# Patient Record
Sex: Female | Born: 1955 | Race: White | Hispanic: No | Marital: Single | Smoking: Former smoker
Health system: Southern US, Community
[De-identification: ages and names within clinical notes are randomized; demographics above are authoritative.]

## PROBLEM LIST (undated history)

## (undated) DIAGNOSIS — R112 Nausea with vomiting, unspecified: Secondary | ICD-10-CM

## (undated) DIAGNOSIS — Z87440 Personal history of urinary (tract) infections: Secondary | ICD-10-CM

## (undated) DIAGNOSIS — D329 Benign neoplasm of meninges, unspecified: Secondary | ICD-10-CM

## (undated) DIAGNOSIS — C801 Malignant (primary) neoplasm, unspecified: Secondary | ICD-10-CM

## (undated) DIAGNOSIS — J40 Bronchitis, not specified as acute or chronic: Secondary | ICD-10-CM

## (undated) DIAGNOSIS — M1990 Unspecified osteoarthritis, unspecified site: Secondary | ICD-10-CM

## (undated) DIAGNOSIS — Z972 Presence of dental prosthetic device (complete) (partial): Secondary | ICD-10-CM

## (undated) DIAGNOSIS — J329 Chronic sinusitis, unspecified: Secondary | ICD-10-CM

## (undated) DIAGNOSIS — J45909 Unspecified asthma, uncomplicated: Secondary | ICD-10-CM

## (undated) DIAGNOSIS — Z9889 Other specified postprocedural states: Secondary | ICD-10-CM

## (undated) DIAGNOSIS — Z9221 Personal history of antineoplastic chemotherapy: Secondary | ICD-10-CM

## (undated) HISTORY — DX: Unspecified asthma, uncomplicated: J45.909

## (undated) HISTORY — PX: TUBAL LIGATION: SHX77

## (undated) HISTORY — PX: CHOLECYSTECTOMY: SHX55

---

## 2009-12-21 HISTORY — PX: BREAST SURGERY: SHX581

## 2010-02-18 DIAGNOSIS — C801 Malignant (primary) neoplasm, unspecified: Secondary | ICD-10-CM

## 2010-02-18 HISTORY — DX: Malignant (primary) neoplasm, unspecified: C80.1

## 2012-07-08 DIAGNOSIS — Z9010 Acquired absence of unspecified breast and nipple: Secondary | ICD-10-CM | POA: Insufficient documentation

## 2013-03-08 ENCOUNTER — Other Ambulatory Visit (HOSPITAL_COMMUNITY): Payer: Self-pay | Admitting: Orthopaedic Surgery

## 2013-05-09 ENCOUNTER — Encounter (HOSPITAL_COMMUNITY): Payer: Self-pay | Admitting: Pharmacy Technician

## 2013-05-12 ENCOUNTER — Ambulatory Visit (HOSPITAL_COMMUNITY)
Admission: RE | Admit: 2013-05-12 | Discharge: 2013-05-12 | Disposition: A | Discharge status: Home / Self Care | Payer: Managed Care, Other (non HMO) | Source: Ambulatory Visit | Attending: Orthopaedic Surgery | Admitting: Orthopaedic Surgery

## 2013-05-12 ENCOUNTER — Encounter (HOSPITAL_COMMUNITY)
Admission: RE | Admit: 2013-05-12 | Discharge: 2013-05-12 | Disposition: A | Discharge status: Home / Self Care | Payer: Managed Care, Other (non HMO) | Source: Ambulatory Visit | Attending: Orthopaedic Surgery | Admitting: Orthopaedic Surgery

## 2013-05-12 ENCOUNTER — Encounter (HOSPITAL_COMMUNITY): Payer: Self-pay

## 2013-05-12 DIAGNOSIS — D329 Benign neoplasm of meninges, unspecified: Secondary | ICD-10-CM

## 2013-05-12 DIAGNOSIS — Z9010 Acquired absence of unspecified breast and nipple: Secondary | ICD-10-CM | POA: Insufficient documentation

## 2013-05-12 DIAGNOSIS — Z01818 Encounter for other preprocedural examination: Secondary | ICD-10-CM | POA: Insufficient documentation

## 2013-05-12 DIAGNOSIS — Z01812 Encounter for preprocedural laboratory examination: Secondary | ICD-10-CM | POA: Insufficient documentation

## 2013-05-12 DIAGNOSIS — Z87891 Personal history of nicotine dependence: Secondary | ICD-10-CM | POA: Insufficient documentation

## 2013-05-12 DIAGNOSIS — M1990 Unspecified osteoarthritis, unspecified site: Secondary | ICD-10-CM | POA: Insufficient documentation

## 2013-05-12 DIAGNOSIS — C801 Malignant (primary) neoplasm, unspecified: Secondary | ICD-10-CM | POA: Insufficient documentation

## 2013-05-12 DIAGNOSIS — J40 Bronchitis, not specified as acute or chronic: Secondary | ICD-10-CM

## 2013-05-12 HISTORY — DX: Benign neoplasm of meninges, unspecified: D32.9

## 2013-05-12 HISTORY — DX: Bronchitis, not specified as acute or chronic: J40

## 2013-05-12 HISTORY — PX: OTHER SURGICAL HISTORY: SHX169

## 2013-05-12 HISTORY — DX: Chronic sinusitis, unspecified: J32.9

## 2013-05-12 HISTORY — DX: Malignant (primary) neoplasm, unspecified: C80.1

## 2013-05-12 HISTORY — DX: Other specified postprocedural states: R11.2

## 2013-05-12 HISTORY — DX: Personal history of urinary (tract) infections: Z87.440

## 2013-05-12 HISTORY — DX: Other specified postprocedural states: Z98.890

## 2013-05-12 LAB — BASIC METABOLIC PANEL
BUN: 12 mg/dL (ref 6–23)
CO2: 28 mEq/L (ref 19–32)
Chloride: 101 mEq/L (ref 96–112)
Creatinine, Ser: 0.6 mg/dL (ref 0.50–1.10)
GFR calc Af Amer: >90 mL/min (ref >90)
Potassium: 3.9 mEq/L (ref 3.5–5.1)

## 2013-05-12 LAB — CBC
HCT: 41 % (ref 36.0–46.0)
Hemoglobin: 14 g/dL (ref 12.0–15.0)
MCHC: 34.1 g/dL (ref 30.0–36.0)
MCV: 94 fL (ref 78.0–100.0)
WBC: 5.5 10*3/uL (ref 4.0–10.5)

## 2013-05-12 LAB — PROTIME-INR
INR: 0.91 (ref 0.00–1.49)
Prothrombin Time: 12.2 seconds (ref 11.6–15.2)

## 2013-05-12 LAB — SURGICAL PCR SCREEN: MRSA, PCR: NEGATIVE

## 2013-05-12 LAB — URINALYSIS, ROUTINE W REFLEX MICROSCOPIC
Bilirubin Urine: NEGATIVE
Glucose, UA: NEGATIVE mg/dL
Hgb urine dipstick: NEGATIVE
Specific Gravity, Urine: 1.01 (ref 1.005–1.030)

## 2013-05-12 NOTE — Pre-Procedure Instructions (Addendum)
05-12-13 CXR done today 05-12-13 1535 Pt. Notified of Positive Staph aureus PCR screen -will use Mupirocin ointment as instructed. RX. Called into [facility] [place], [place] per pt. Request.

## 2013-05-12 NOTE — Patient Instructions (Addendum)
20 Lache Dagher  05/12/2013   Your procedure is scheduled on: 6-6  -2014  Report to [facility] at     0700   AM   Call this number if you have problems the morning of surgery: [phone]  Or Presurgical Testing [phone](Seanpatrick Maisano)      Do not eat food:After Midnight.    Take these medicines the morning of surgery with A SIP OF WATER: Femara. Ask MD about Meloxicam. Stop all over the counter vitamins and herbal supplements.   Do not wear jewelry, make-up or nail polish.  Do not wear lotions, powders, or perfumes. You may wear deodorant.  Do not shave 12 hours prior to first CHG shower(legs and under arms).(face and neck okay.)  Do not bring valuables to the hospital.  Contacts, dentures or bridgework,body piercing,  may not be worn into surgery.  Leave suitcase in the car. After surgery it may be brought to your room.  For patients admitted to the hospital, checkout time is 11:00 AM the day of discharge.   Patients discharged the day of surgery will not be allowed to drive home. Must have responsible person with you x 24 hours once discharged.  Name and phone number of your driver: Destini Cambre -daughter [phone] cell  Special Instructions: CHG(Chlorhedine 4%-"Hibiclens","Betasept","Aplicare") Education officer, community Wash: see special instructions.(avoid face and genitals)   Please read over the following fact sheets that you were given: MRSA Information, Blood Transfusion fact sheet, Incentive Spirometry Instruction.    Failure to follow these instructions may result in Cancellation of your surgery.   Patient signature_______________________________________________________

## 2013-05-26 ENCOUNTER — Encounter (HOSPITAL_COMMUNITY)
Admission: RE | Disposition: A | Discharge status: Home Health | Payer: Self-pay | Source: Ambulatory Visit | Attending: Orthopaedic Surgery

## 2013-05-26 ENCOUNTER — Inpatient Hospital Stay (HOSPITAL_COMMUNITY)
Admission: RE | Admit: 2013-05-26 | Discharge: 2013-05-28 | DRG: 470 | Disposition: A | Discharge status: Home Health | Payer: Managed Care, Other (non HMO) | Source: Ambulatory Visit | Attending: Orthopaedic Surgery | Admitting: Orthopaedic Surgery

## 2013-05-26 ENCOUNTER — Encounter (HOSPITAL_COMMUNITY): Payer: Self-pay | Admitting: Anesthesiology

## 2013-05-26 ENCOUNTER — Inpatient Hospital Stay (HOSPITAL_COMMUNITY): Payer: Managed Care, Other (non HMO)

## 2013-05-26 ENCOUNTER — Inpatient Hospital Stay (HOSPITAL_COMMUNITY): Payer: Managed Care, Other (non HMO) | Admitting: Anesthesiology

## 2013-05-26 ENCOUNTER — Encounter (HOSPITAL_COMMUNITY): Payer: Self-pay | Admitting: *Deleted

## 2013-05-26 DIAGNOSIS — D62 Acute posthemorrhagic anemia: Secondary | ICD-10-CM | POA: Diagnosis not present

## 2013-05-26 DIAGNOSIS — M169 Osteoarthritis of hip, unspecified: Principal | ICD-10-CM | POA: Diagnosis present

## 2013-05-26 DIAGNOSIS — M1610 Unilateral primary osteoarthritis, unspecified hip: Principal | ICD-10-CM | POA: Diagnosis present

## 2013-05-26 DIAGNOSIS — T50995A Adverse effect of other drugs, medicaments and biological substances, initial encounter: Secondary | ICD-10-CM | POA: Diagnosis not present

## 2013-05-26 DIAGNOSIS — R110 Nausea: Secondary | ICD-10-CM | POA: Diagnosis not present

## 2013-05-26 HISTORY — PX: TOTAL HIP ARTHROPLASTY: SHX124

## 2013-05-26 LAB — TYPE AND SCREEN
ABO/RH(D): O POS
Antibody Screen: NEGATIVE

## 2013-05-26 SURGERY — ARTHROPLASTY, HIP, TOTAL, ANTERIOR APPROACH
Anesthesia: Spinal | Site: Hip | Laterality: Left | Wound class: Clean

## 2013-05-26 MED ORDER — MENTHOL 3 MG MT LOZG: 1.0000 | LOZENGE | OROMUCOSAL | Status: DC | PRN

## 2013-05-26 MED ORDER — CEFAZOLIN SODIUM 1-5 GM-% IV SOLN
1.0000 g | Freq: Four times a day (QID) | INTRAVENOUS | Status: AC
  Administered 2013-05-26 (×2): 1 g via INTRAVENOUS
  Filled 2013-05-26 (×2): qty 50

## 2013-05-26 MED ORDER — ASPIRIN EC 325 MG PO TBEC
325.0000 mg | DELAYED_RELEASE_TABLET | Freq: Two times a day (BID) | ORAL | Status: DC
  Administered 2013-05-26 – 2013-05-28 (×4): 325 mg via ORAL
  Filled 2013-05-26 (×6): qty 1

## 2013-05-26 MED ORDER — BUPIVACAINE HCL (PF) 0.5 % IJ SOLN
INTRAMUSCULAR | Status: DC | PRN
  Administered 2013-05-26: 12.5 mg

## 2013-05-26 MED ORDER — LETROZOLE 2.5 MG PO TABS
2.5000 mg | ORAL_TABLET | Freq: Every morning | ORAL | Status: DC
  Filled 2013-05-26: qty 1

## 2013-05-26 MED ORDER — GLYCOPYRROLATE 0.2 MG/ML IJ SOLN
INTRAMUSCULAR | Status: DC | PRN
  Administered 2013-05-26: 0.2 mg via INTRAVENOUS

## 2013-05-26 MED ORDER — VITAMIN C 500 MG PO TABS
500.0000 mg | ORAL_TABLET | Freq: Every day | ORAL | Status: DC
  Administered 2013-05-27 – 2013-05-28 (×2): 500 mg via ORAL
  Filled 2013-05-26 (×3): qty 1

## 2013-05-26 MED ORDER — METOCLOPRAMIDE HCL 5 MG/ML IJ SOLN: 5.0000 mg | Freq: Three times a day (TID) | INTRAMUSCULAR | Status: DC | PRN

## 2013-05-26 MED ORDER — PHENYLEPHRINE HCL 10 MG/ML IJ SOLN
10.0000 mg | INTRAVENOUS | Status: DC | PRN
  Administered 2013-05-26: 50 ug/min via INTRAVENOUS

## 2013-05-26 MED ORDER — HYDROMORPHONE HCL PF 1 MG/ML IJ SOLN: 1.0000 mg | INTRAMUSCULAR | Status: DC | PRN

## 2013-05-26 MED ORDER — FENTANYL CITRATE 0.05 MG/ML IJ SOLN: 25.0000 ug | INTRAMUSCULAR | Status: DC | PRN

## 2013-05-26 MED ORDER — KETOROLAC TROMETHAMINE 30 MG/ML IJ SOLN: 15.0000 mg | Freq: Once | INTRAMUSCULAR | Status: DC | PRN

## 2013-05-26 MED ORDER — ONDANSETRON HCL 4 MG/2ML IJ SOLN: 4.0000 mg | Freq: Four times a day (QID) | INTRAMUSCULAR | Status: DC | PRN

## 2013-05-26 MED ORDER — PROMETHAZINE HCL 25 MG/ML IJ SOLN: 6.2500 mg | INTRAMUSCULAR | Status: DC | PRN

## 2013-05-26 MED ORDER — OXYCODONE HCL 5 MG PO TABS
5.0000 mg | ORAL_TABLET | ORAL | Status: DC | PRN
  Administered 2013-05-26 – 2013-05-27 (×4): 5 mg via ORAL
  Administered 2013-05-27: 10 mg via ORAL
  Administered 2013-05-27 – 2013-05-28 (×3): 5 mg via ORAL
  Filled 2013-05-26 (×2): qty 1
  Filled 2013-05-26 (×2): qty 2
  Filled 2013-05-26: qty 1
  Filled 2013-05-26: qty 2
  Filled 2013-05-26 (×2): qty 1

## 2013-05-26 MED ORDER — METOCLOPRAMIDE HCL 10 MG PO TABS
5.0000 mg | ORAL_TABLET | Freq: Three times a day (TID) | ORAL | Status: DC | PRN
  Administered 2013-05-28: 10 mg via ORAL
  Filled 2013-05-26: qty 1

## 2013-05-26 MED ORDER — PROPOFOL INFUSION 10 MG/ML OPTIME
INTRAVENOUS | Status: DC | PRN
  Administered 2013-05-26: 120 ug/kg/min via INTRAVENOUS

## 2013-05-26 MED ORDER — FENTANYL CITRATE 0.05 MG/ML IJ SOLN
INTRAMUSCULAR | Status: DC | PRN
  Administered 2013-05-26 (×2): 50 ug via INTRAVENOUS

## 2013-05-26 MED ORDER — CEFAZOLIN SODIUM-DEXTROSE 2-3 GM-% IV SOLR
2.0000 g | INTRAVENOUS | Status: AC
  Administered 2013-05-26: 2 g via INTRAVENOUS

## 2013-05-26 MED ORDER — ALUM & MAG HYDROXIDE-SIMETH 200-200-20 MG/5ML PO SUSP: 30.0000 mL | ORAL | Status: DC | PRN

## 2013-05-26 MED ORDER — LACTATED RINGERS IV SOLN
INTRAVENOUS | Status: DC
  Administered 2013-05-26: 10:00:00 via INTRAVENOUS
  Administered 2013-05-26: 1000 mL via INTRAVENOUS

## 2013-05-26 MED ORDER — MIDAZOLAM HCL 5 MG/5ML IJ SOLN
INTRAMUSCULAR | Status: DC | PRN
  Administered 2013-05-26: 2 mg via INTRAVENOUS

## 2013-05-26 MED ORDER — ONDANSETRON HCL 4 MG/2ML IJ SOLN
4.0000 mg | Freq: Once | INTRAMUSCULAR | Status: AC
  Administered 2013-05-26: 4 mg via INTRAVENOUS

## 2013-05-26 MED ORDER — POLYETHYLENE GLYCOL 3350 17 G PO PACK
17.0000 g | PACK | Freq: Every day | ORAL | Status: DC | PRN
  Administered 2013-05-27: 17 g via ORAL
  Filled 2013-05-26: qty 1

## 2013-05-26 MED ORDER — METHOCARBAMOL 100 MG/ML IJ SOLN: 500.0000 mg | Freq: Four times a day (QID) | INTRAVENOUS | Status: DC | PRN

## 2013-05-26 MED ORDER — EPHEDRINE SULFATE 50 MG/ML IJ SOLN
INTRAMUSCULAR | Status: DC | PRN
  Administered 2013-05-26: 10 mg via INTRAVENOUS
  Administered 2013-05-26: 5 mg via INTRAVENOUS
  Administered 2013-05-26: 10 mg via INTRAVENOUS

## 2013-05-26 MED ORDER — BISACODYL 10 MG RE SUPP: 10.0000 mg | Freq: Every day | RECTAL | Status: DC | PRN

## 2013-05-26 MED ORDER — FERROUS SULFATE 325 (65 FE) MG PO TABS
325.0000 mg | ORAL_TABLET | Freq: Three times a day (TID) | ORAL | Status: DC
  Administered 2013-05-27 – 2013-05-28 (×4): 325 mg via ORAL
  Filled 2013-05-26 (×8): qty 1

## 2013-05-26 MED ORDER — SODIUM CHLORIDE 0.9 % IV SOLN
INTRAVENOUS | Status: DC
  Administered 2013-05-26 – 2013-05-27 (×2): via INTRAVENOUS

## 2013-05-26 MED ORDER — ACETAMINOPHEN 650 MG RE SUPP: 650.0000 mg | Freq: Four times a day (QID) | RECTAL | Status: DC | PRN

## 2013-05-26 MED ORDER — DEXAMETHASONE SODIUM PHOSPHATE 10 MG/ML IJ SOLN
INTRAMUSCULAR | Status: DC | PRN
  Administered 2013-05-26: 10 mg via INTRAVENOUS

## 2013-05-26 MED ORDER — DIPHENHYDRAMINE HCL 12.5 MG/5ML PO ELIX: 12.5000 mg | ORAL_SOLUTION | ORAL | Status: DC | PRN

## 2013-05-26 MED ORDER — ZOLPIDEM TARTRATE 5 MG PO TABS: 5.0000 mg | ORAL_TABLET | Freq: Every evening | ORAL | Status: DC | PRN

## 2013-05-26 MED ORDER — ACETAMINOPHEN 325 MG PO TABS: 650.0000 mg | ORAL_TABLET | Freq: Four times a day (QID) | ORAL | Status: DC | PRN

## 2013-05-26 MED ORDER — SODIUM CHLORIDE 0.9 % IR SOLN
Status: DC | PRN
  Administered 2013-05-26: 1000 mL

## 2013-05-26 MED ORDER — ONDANSETRON HCL 4 MG PO TABS
4.0000 mg | ORAL_TABLET | Freq: Four times a day (QID) | ORAL | Status: DC | PRN
  Administered 2013-05-27 – 2013-05-28 (×3): 4 mg via ORAL
  Filled 2013-05-26 (×3): qty 1

## 2013-05-26 MED ORDER — DOCUSATE SODIUM 100 MG PO CAPS
100.0000 mg | ORAL_CAPSULE | Freq: Two times a day (BID) | ORAL | Status: DC
Start: 2013-05-26 — End: 2013-05-28
  Administered 2013-05-27 – 2013-05-28 (×3): 100 mg via ORAL
  Filled 2013-05-26 (×4): qty 1

## 2013-05-26 MED ORDER — PHENOL 1.4 % MT LIQD: 1.0000 | OROMUCOSAL | Status: DC | PRN

## 2013-05-26 MED ORDER — PHENYLEPHRINE HCL 10 MG/ML IJ SOLN
INTRAMUSCULAR | Status: DC | PRN
  Administered 2013-05-26: 40 ug via INTRAVENOUS

## 2013-05-26 MED ORDER — 0.9 % SODIUM CHLORIDE (POUR BTL) OPTIME
TOPICAL | Status: DC | PRN
  Administered 2013-05-26: 1000 mL

## 2013-05-26 MED ORDER — METHOCARBAMOL 500 MG PO TABS
500.0000 mg | ORAL_TABLET | Freq: Four times a day (QID) | ORAL | Status: DC | PRN
  Administered 2013-05-26 – 2013-05-27 (×2): 500 mg via ORAL
  Filled 2013-05-26 (×2): qty 1

## 2013-05-26 SURGICAL SUPPLY — 38 items
BAG ZIPLOCK 12X15 (MISCELLANEOUS) ×4 IMPLANT
BLADE SAW SGTL 18X1.27X75 (BLADE) ×2 IMPLANT
CAPT HIP PF COP ×2 IMPLANT
CELLS DAT CNTRL 66122 CELL SVR (MISCELLANEOUS) ×1 IMPLANT
CLOTH BEACON ORANGE TIMEOUT ST (SAFETY) ×2 IMPLANT
DERMABOND ADVANCED (GAUZE/BANDAGES/DRESSINGS) ×1
DERMABOND ADVANCED .7 DNX12 (GAUZE/BANDAGES/DRESSINGS) ×1 IMPLANT
DRAPE C-ARM 42X120 X-RAY (DRAPES) ×2 IMPLANT
DRAPE STERI IOBAN 125X83 (DRAPES) ×2 IMPLANT
DRAPE U-SHAPE 47X51 STRL (DRAPES) ×6 IMPLANT
DRSG AQUACEL AG ADV 3.5X10 (GAUZE/BANDAGES/DRESSINGS) ×2 IMPLANT
DURAPREP 26ML APPLICATOR (WOUND CARE) ×2 IMPLANT
ELECT BLADE TIP CTD 4 INCH (ELECTRODE) ×2 IMPLANT
ELECT REM PT RETURN 9FT ADLT (ELECTROSURGICAL) ×2
ELECTRODE REM PT RTRN 9FT ADLT (ELECTROSURGICAL) ×1 IMPLANT
FACESHIELD LNG OPTICON STERILE (SAFETY) ×8 IMPLANT
GLOVE BIO SURGEON STRL SZ7.5 (GLOVE) ×2 IMPLANT
GLOVE BIOGEL PI IND STRL 8 (GLOVE) ×2 IMPLANT
GLOVE BIOGEL PI INDICATOR 8 (GLOVE) ×2
GLOVE ECLIPSE 8.0 STRL XLNG CF (GLOVE) ×2 IMPLANT
GOWN STRL REIN XL XLG (GOWN DISPOSABLE) ×4 IMPLANT
HANDPIECE INTERPULSE COAX TIP (DISPOSABLE) ×1
KIT BASIN OR (CUSTOM PROCEDURE TRAY) ×2 IMPLANT
PACK TOTAL JOINT (CUSTOM PROCEDURE TRAY) ×2 IMPLANT
PADDING CAST COTTON 6X4 STRL (CAST SUPPLIES) ×2 IMPLANT
RTRCTR WOUND ALEXIS 18CM MED (MISCELLANEOUS) ×2
SET HNDPC FAN SPRY TIP SCT (DISPOSABLE) ×1 IMPLANT
SUT ETHIBOND NAB CT1 #1 30IN (SUTURE) ×2 IMPLANT
SUT ETHILON 3 0 PS 1 (SUTURE) IMPLANT
SUT MNCRL AB 4-0 PS2 18 (SUTURE) ×2 IMPLANT
SUT VIC AB 0 CT1 36 (SUTURE) ×2 IMPLANT
SUT VIC AB 1 CT1 36 (SUTURE) ×2 IMPLANT
SUT VIC AB 2-0 CT1 27 (SUTURE) ×1
SUT VIC AB 2-0 CT1 TAPERPNT 27 (SUTURE) ×1 IMPLANT
TOWEL OR 17X26 10 PK STRL BLUE (TOWEL DISPOSABLE) ×2 IMPLANT
TOWEL OR NON WOVEN STRL DISP B (DISPOSABLE) ×2 IMPLANT
TRAY FOLEY CATH 14FRSI W/METER (CATHETERS) ×2 IMPLANT
TUBING CONNECTING 10 (TUBING) ×2 IMPLANT

## 2013-05-26 NOTE — Anesthesia Preprocedure Evaluation (Signed)
Anesthesia Evaluation  Patient identified by MRN, date of birth, ID band Patient awake  General Assessment Comment:Cancer 05-12-13 left breast cancer   Reviewed: Allergy & Precautions, H&P , NPO status , Patient's Chart, lab work & pertinent test results  Airway Mallampati: II TM Distance: >3 FB Neck ROM: Full    Dental no notable dental hx.    Pulmonary neg pulmonary ROS,  breath sounds clear to auscultation  Pulmonary exam normal       Cardiovascular negative cardio ROS  Rhythm:Regular Rate:Normal     Neuro/Psych negative neurological ROS  negative psych ROS   GI/Hepatic negative GI ROS, Neg liver ROS,   Endo/Other  negative endocrine ROS  Renal/GU negative Renal ROS  negative genitourinary   Musculoskeletal negative musculoskeletal ROS (+)   Abdominal   Peds negative pediatric ROS (+)  Hematology negative hematology ROS (+)   Anesthesia Other Findings   Reproductive/Obstetrics negative OB ROS                           Anesthesia Physical Anesthesia Plan  ASA: II  Anesthesia Plan: Spinal   Post-op Pain Management:    Induction:   Airway Management Planned: Nasal Cannula  Additional Equipment:   Intra-op Plan:   Post-operative Plan:   Informed Consent: I have reviewed the patients History and Physical, chart, labs and discussed the procedure including the risks, benefits and alternatives for the proposed anesthesia with the patient or authorized representative who has indicated his/her understanding and acceptance.     Plan Discussed with: CRNA and Surgeon  Anesthesia Plan Comments:         Anesthesia Quick Evaluation

## 2013-05-26 NOTE — H&P (Signed)
TOTAL HIP ADMISSION H&P  Patient is admitted for left total hip arthroplasty.  Subjective:  Chief Complaint: left hip pain  HPI: [name], 57 y.o. female, has a history of pain and functional disability in the left hip(s) due to arthritis and patient has failed non-surgical conservative treatments for greater than 12 weeks to include NSAID's and/or analgesics, corticosteriod injections, use of assistive devices, weight reduction as appropriate and activity modification.  Onset of symptoms was gradual starting 5 years ago with gradually worsening course since that time.The patient noted no past surgery on the left hip(s).  Patient currently rates pain in the left hip at 8 out of 10 with activity. Patient has night pain, worsening of pain with activity and weight bearing, trendelenberg gait, pain that interfers with activities of daily living and pain with passive range of motion. Patient has evidence of subchondral cysts, subchondral sclerosis, periarticular osteophytes and joint space narrowing by imaging studies. This condition presents safety issues increasing the risk of falls.  There is no current active infection.  Patient Active Problem List   Diagnosis Date Noted  . Degenerative arthritis of hip 05/26/2013   Past Medical History  Diagnosis Date  . PONV (postoperative nausea and vomiting)   . Bronchitis 05-12-13    occ. bouts with bronchitis  . Recurrent sinus infections     last  2 months ago  . History of frequent urinary tract infections     last tx. 1 month ago  . Benign meningioma 05-12-13    dx. '98- small, no problems  . Cancer 05-12-13    left breast cancer    Past Surgical History  Procedure Laterality Date  . Tubal ligation    . Cholecystectomy    . Breast surgery Left 05-12-13    '11- left breast mastectomy  . Port-acath  05-12-13    insertion and removal    Prescriptions prior to admission  Medication Sig Dispense Refill  . alendronate (FOSAMAX) 70 MG tablet  Take 70 mg by mouth every Sunday. Take with a full glass of water on an empty stomach.      . calcium carbonate (OS-CAL) 600 MG TABS Take 600 mg by mouth daily.      Marland Kitchen letrozole (FEMARA) 2.5 MG tablet Take 2.5 mg by mouth every morning.      . meloxicam (MOBIC) 15 MG tablet Take 15 mg by mouth daily.      . Omega-3 Fatty Acids (FISH OIL) 1200 MG CAPS Take 1,200 mg by mouth daily.      . vitamin C (ASCORBIC ACID) 500 MG tablet Take 500 mg by mouth daily.       Allergies  Allergen Reactions  . Other     "narcotics" causes nausea and vomiting    History  Substance Use Topics  . Smoking status: Former Smoker    Types: Cigarettes    Quit date: 05/12/2010  . Smokeless tobacco: Not on file  . Alcohol Use: Yes     Comment: social    No family history on file.   Review of Systems  Musculoskeletal: Positive for joint pain.  All other systems reviewed and are negative.    Objective:  Physical Exam  Constitutional: She is oriented to person, place, and time. She appears well-developed and well-nourished.  HENT:  Head: Atraumatic.  Eyes: EOM are normal. Pupils are equal, round, and reactive to light.  Neck: Normal range of motion. Neck supple.  Cardiovascular: Normal rate and regular rhythm.  Respiratory: Effort normal and breath sounds normal.  GI: Soft. Bowel sounds are normal.  Musculoskeletal:       Left hip: She exhibits decreased range of motion, decreased strength, bony tenderness and crepitus.  Neurological: She is alert and oriented to person, place, and time.  Skin: Skin is warm.  Psychiatric: She has a normal mood and affect.    Vital signs in last 24 hours: Temp:  [98.2 F (36.8 C)] 98.2 F (36.8 C) (06/06 0711) Pulse Rate:  [83] 83 (06/06 0711) Resp:  [18] 18 (06/06 0711) BP: (122)/(54) 122/54 mmHg (06/06 0711) SpO2:  [99 %] 99 % (06/06 0711)  Labs:   There is no weight on file to calculate BMI.   Imaging Review Plain radiographs demonstrate severe  degenerative joint disease of the bilateral hip(s). The bone quality appears to be good for age and reported activity level.  Assessment/Plan:  End stage arthritis, left hip(s)  The patient history, physical examination, clinical judgement of the provider and imaging studies are consistent with end stage degenerative joint disease of the left hip(s) and total hip arthroplasty is deemed medically necessary. The treatment options including medical management, injection therapy, arthroscopy and arthroplasty were discussed at length. The risks and benefits of total hip arthroplasty were presented and reviewed. The risks due to aseptic loosening, infection, stiffness, dislocation/subluxation,  thromboembolic complications and other imponderables were discussed.  The patient acknowledged the explanation, agreed to proceed with the plan and consent was signed. Patient is being admitted for inpatient treatment for surgery, pain control, PT, OT, prophylactic antibiotics, VTE prophylaxis, progressive ambulation and ADL's and discharge planning.The patient is planning to be discharged home with home health services

## 2013-05-26 NOTE — Plan of Care (Signed)
Problem: Consults Goal: Diagnosis- Total Joint Replacement Left total hip anterior approach

## 2013-05-26 NOTE — Anesthesia Procedure Notes (Signed)

## 2013-05-26 NOTE — Progress Notes (Signed)
Utilization review completed.  

## 2013-05-26 NOTE — Anesthesia Postprocedure Evaluation (Signed)
  Anesthesia Post-op Note  Patient: [name]  Procedure(s) Performed: Procedure(s) (LRB): Left TOTAL HIP ARTHROPLASTY ANTERIOR APPROACH (Left)  Patient Location: PACU  Anesthesia Type: Spinal  Level of Consciousness: awake and alert   Airway and Oxygen Therapy: Patient Spontanous Breathing  Post-op Pain: mild  Post-op Assessment: Post-op Vital signs reviewed, Patient's Cardiovascular Status Stable, Respiratory Function Stable, Patent Airway and No signs of Nausea or vomiting  Last Vitals:  Filed Vitals:   05/26/13 1230  BP: 92/54  Pulse: 60  Temp: 36.4 C  Resp: 23    Post-op Vital Signs: stable   Complications: No apparent anesthesia complications

## 2013-05-26 NOTE — Brief Op Note (Signed)
05/26/2013  11:21 AM  PATIENT:  [name]  57 y.o. female  PRE-OPERATIVE DIAGNOSIS:  Severe osteoarthritis left hip  POST-OPERATIVE DIAGNOSIS:  Severe osteoarthritis left hip  PROCEDURE:  Procedure(s): Left TOTAL HIP ARTHROPLASTY ANTERIOR APPROACH (Left)  SURGEON:  Surgeon(s) and Role:    * Kathryne Hitch, MD - Primary  PHYSICIAN ASSISTANT: Rexene Edison, PA-C    ANESTHESIA:   spinal  EBL:  Total I/O In: 1000 [I.V.:1000] Out: 500 [Urine:150; Blood:350]  BLOOD ADMINISTERED:none  DRAINS: none   LOCAL MEDICATIONS USED:  NONE  SPECIMEN:  No Specimen  DISPOSITION OF SPECIMEN:  N/A  COUNTS:  YES  TOURNIQUET:  * No tourniquets in log *  DICTATION: .Other Dictation: Dictation Number [id number]  PLAN OF CARE: Admit to inpatient   PATIENT DISPOSITION:  PACU - hemodynamically stable.   Delay start of Pharmacological VTE agent (>24hrs) due to surgical blood loss or risk of bleeding: no

## 2013-05-26 NOTE — Evaluation (Signed)
Physical Therapy Evaluation Patient Details Name: [name] MRN: [id number] DOB: [birth year] Today's Date: 05/26/2013 Time: 0865-7846 PT Time Calculation (min): 20 min  PT Assessment / Plan / Recommendation Clinical Impression  Pt presents s/p L THA (direct ant) POD 0 with decreased strength, ROM and mobility.  Tolerated OOB and ambulation in hallway well with RW at min assist.  Pt will benefit from skilled PT in acute venue to address deficits.  PT recommends HHPT for follow up at D/C to maximize pts safety and function.      PT Assessment  Patient needs continued PT services    Follow Up Recommendations  Home health PT    Does the patient have the potential to tolerate intense rehabilitation      Barriers to Discharge None      Equipment Recommendations  Rolling walker with 5" wheels    Recommendations for Other Services     Frequency 7X/week    Precautions / Restrictions Precautions Precautions: None Restrictions Weight Bearing Restrictions: No Other Position/Activity Restrictions: WBAT LLE   Pertinent Vitals/Pain 4/10 pain      Mobility  Bed Mobility Bed Mobility: Supine to Sit Supine to Sit: 4: Min assist Details for Bed Mobility Assistance: Assist for LLE out of bed with min cues for hand placement to self assist.  Transfers Transfers: Sit to Stand;Stand to Sit Sit to Stand: 4: Min assist;With upper extremity assist;From bed Stand to Sit: 4: Min assist;With upper extremity assist;With armrests;To chair/3-in-1 Details for Transfer Assistance: Assist to rise and steady with cues for hand placement and LE management.  Ambulation/Gait Ambulation/Gait Assistance: 4: Min assist Ambulation Distance (Feet): 50 Feet Assistive device: Rolling walker Ambulation/Gait Assistance Details: Cues for sequencing/technique with RW and to maintain upright posture throughout. note pt tends to push RW too far ahead of her but can correct with cues.  Gait Pattern: Step-to  pattern;Antalgic;Trunk flexed Gait velocity: decreased Stairs: No Wheelchair Mobility Wheelchair Mobility: No    Exercises     PT Diagnosis: Difficulty walking;Generalized weakness;Acute pain  PT Problem List: Decreased strength;Decreased range of motion;Decreased activity tolerance;Decreased balance;Decreased mobility;Decreased knowledge of use of DME;Pain PT Treatment Interventions: DME instruction;Gait training;Functional mobility training;Therapeutic activities;Therapeutic exercise;Balance training;Patient/family education   PT Goals Acute Rehab PT Goals PT Goal Formulation: With patient Time For Goal Achievement: 06/02/13 Potential to Achieve Goals: Good Pt will go Supine/Side to Sit: with supervision PT Goal: Supine/Side to Sit - Progress: Goal set today Pt will go Sit to Supine/Side: with supervision PT Goal: Sit to Supine/Side - Progress: Goal set today Pt will go Sit to Stand: with supervision PT Goal: Sit to Stand - Progress: Goal set today Pt will Ambulate: 51 - 150 feet;with supervision;with least restrictive assistive device PT Goal: Ambulate - Progress: Goal set today Pt will Perform Home Exercise Program: with supervision, verbal cues required/provided PT Goal: Perform Home Exercise Program - Progress: Goal set today  Visit Information  Last PT Received On: 05/26/13 Assistance Needed: +1    Subjective Data  Subjective: I'm just sore Patient Stated Goal: to return home.    Prior Functioning  Home Living Lives With: Friend(s) Available Help at Discharge: Friend(s);Available 24 hours/day Type of Home: Apartment Home Access: Level entry Home Layout: One level Bathroom Shower/Tub: Engineer, manufacturing systems: Standard Home Adaptive Equipment: Straight cane Prior Function Level of Independence: Independent Able to Take Stairs?: Yes Driving: Yes Communication Communication: No difficulties    Cognition  Cognition Arousal/Alertness:  Awake/alert Behavior During Therapy: WFL for tasks assessed/performed  Overall Cognitive Status: Within Functional Limits for tasks assessed    Extremity/Trunk Assessment Right Lower Extremity Assessment RLE ROM/Strength/Tone: [facility] for tasks assessed Left Lower Extremity Assessment LLE ROM/Strength/Tone: Deficits LLE ROM/Strength/Tone Deficits: hip add 2-/5, hip flex 2-/5 LLE Sensation: WFL - Light Touch   Balance    End of Session PT - End of Session Equipment Utilized During Treatment: Gait belt Activity Tolerance: Patient tolerated treatment well Patient left: in chair;with call bell/phone within reach;with family/visitor present Nurse Communication: Mobility status  GP     Vista Deck 05/26/2013, 4:41 PM

## 2013-05-26 NOTE — Transfer of Care (Signed)
Immediate Anesthesia Transfer of Care Note  Patient: [name]  Procedure(s) Performed: Procedure(s): Left TOTAL HIP ARTHROPLASTY ANTERIOR APPROACH (Left)  Patient Location: PACU  Anesthesia Type:MAC and Spinal  Level of Consciousness: awake and alert   Airway & Oxygen Therapy: Patient Spontanous Breathing and Patient connected to face mask oxygen  Post-op Assessment: Report given to PACU RN and Post -op Vital signs reviewed and stable  Post vital signs: Reviewed and stable  Complications: No apparent anesthesia complications

## 2013-05-26 NOTE — Op Note (Signed)
[name], Roy                 ACCOUNT NO.:  [id number]  MEDICAL RECORD NO.:  [id number]  LOCATION:  1609                         FACILITY:  [facility]  PHYSICIAN:  Vanita Panda. Magnus Ivan, M.D.DATE OF BIRTH:  05/01/1956  DATE OF PROCEDURE:  05/26/2013 DATE OF DISCHARGE:                              OPERATIVE REPORT   PREOPERATIVE DIAGNOSIS:  Severe end-stage arthritis and degenerative joint disease, left hip.  POSTOPERATIVE DIAGNOSIS:  Severe end-stage arthritis and degenerative joint disease, left hip.  PROCEDURE:  Left total hip arthroplasty through direct anterior approach.  IMPLANTS:  DePuy Sector Gription acetabular component size 50, size 32+ 4 neutral polyethylene liner, size 9 Corail femoral component with standard offset, size 32+ 1 ceramic hip ball.  SURGEON:  Vanita Panda. Magnus Ivan, M.D.  ASSISTANT:  Richardean Canal, P.A-C.  ANESTHESIA:  Spinal.  ESTIMATED BLOOD LOSS:  Less than 500 mL.  ANTIBIOTICS:  2 g IV Ancef.  COMPLICATIONS:  None.  INDICATIONS:  [name] is a very pleasant, healthy 57 year old female with bilateral hip severe degenerative joint disease.  She has chosen to have her left total hip replaced first given the failure of conservative treatment, including injections, anti-inflammatories, walking with assisted devices, and rest, as well as activity modification.  She has x- rays that show end-stage arthritis with complete loss of her joint space collapse of femoral head, subchondral sclerosis, cystic changes on both the femoral head and acetabulum, periarticular osteophytes.  Her pain is daily.  Her quality of life has diminished.  Her activities of daily living are "greatly affected as well."  The risks of surgery include acute blood loss anemia, nerve and vessel injury, infection, fracture, DVT with the goals being decreased pain, improved mobility, and improved quality of life.  DESCRIPTION OF PROCEDURE:  After informed consent was  obtained, appropriate left hip was marked.  She was brought to the operating room, and while she was on her stretcher, she was sat up and spinal anesthesia was obtained.  She was then laid back into supine position.  A Foley catheter was placed and then both feet had traction boots applied, so she could next be placed supine on the Hana fracture table.  Perineal post was placed and both legs were placed in traction devices in inline skeletal traction but no traction applied.  Her left operative hip was then prepped and draped with DuraPrep and sterile drapes.  Time-out was called to identify the correct patient, correct left hip.  We then made an incision just inferior and posterior to the anterior superior iliac spine and dissected obliquely down into the leg.  We dissected the tensor fascia lata muscle.  The tensor fascia was then divided longitudinally and I next proceeded with a direct anterior approach to the hip.  A Cobra retractor was placed around the lateral neck and up underneath the rectus femoris, a medial retractor was placed.  I cauterized the lateral femoral circumflex vessels.  We then opened up the hip capsule in a L type format, placing the Cobra retractors within the hip capsule.  I then made my femoral neck cut just proximal to the lesser trochanter using oscillating saw.  Completed this with an  osteotome.  I placed a corkscrew guide in the femoral head and removed the femoral head in its entirety and found it to be devoid of cartilage. I then cleaned the remnants of the acetabulum and ligament of the fovea, released the transversalis acetabular ligament and remove periarticular osteophytes from around the acetabulum and remnants of the labrum.  I then began reaming from size 42 reamer and 2 mm increments up to size 50 with all reamers placed under direct visualization and the last reamer also under direct fluoroscopy selected and adapted review my inclination and  anteversion.  Once I was pleased with this, I placed the real DePuy Sector Gription acetabular component size 50, with the apex hole eliminator guide followed by the real 32+ 4 neutral polyethylene liner. Attention was then turned to the femur with the leg externally rotated to 90 degrees, extended and adducted.  We gained access to the femoral canal with a Mueller retractor placed medially and Hohmann retractor behind the greater trochanter.  I used a rongeur and a box cutting guide to enter the femoral canal to lateralize.  I then began broaching using the Corail broaching system from a size 8 only up to a size 9.  I used a calcar planer for this and then placed a standard neck trial followed by a 32+ 1 hip ball.  We brought the leg back over and up with traction and internal rotation.  This and the pelvis, it was stable with internal and external rotation with minimal shuck.  Her leg lengths were measured equal under direct fluoroscopy.  I then dislocated the hip and removed the trial components, and placed the real DePuy Corail femoral component size 9 with standard offset and the real 32+ 1 ceramic hip ball.  We reduced this into the acetabulum stable.  We copiously irrigated the soft tissues with normal saline solution using pulsatile lavage.  I cauterized the bleeding vessels, closed the joint capsule with interrupted #1 Ethibond suture followed by running #1 Vicryl in the tensor fascia, 0 Vicryl to the deep tissue, 2-0 Vicryl to the subcutaneous  tissue, 4-0 Monocryl on the subcuticular tissue, and then Dermabond on the skin.  An Aquacel dressing was applied.  She was taken off the Hana table to the recovery in stable condition.  All final counts were correct.  No complications noted.  Of note, Richardean Canal, P.A-C., presence was integral throughout the case, especially with the patient positioning, retracting, and closure of the wound.     Vanita Panda. Magnus Ivan,  M.D.     CYB/MEDQ  D:  05/26/2013  T:  05/26/2013  Job:  [id number]

## 2013-05-27 DIAGNOSIS — J3489 Other specified disorders of nose and nasal sinuses: Secondary | ICD-10-CM | POA: Insufficient documentation

## 2013-05-27 DIAGNOSIS — L03019 Cellulitis of unspecified finger: Secondary | ICD-10-CM | POA: Insufficient documentation

## 2013-05-27 DIAGNOSIS — J209 Acute bronchitis, unspecified: Secondary | ICD-10-CM | POA: Insufficient documentation

## 2013-05-27 DIAGNOSIS — C50919 Malignant neoplasm of unspecified site of unspecified female breast: Secondary | ICD-10-CM | POA: Insufficient documentation

## 2013-05-27 DIAGNOSIS — M545 Low back pain, unspecified: Secondary | ICD-10-CM | POA: Insufficient documentation

## 2013-05-27 DIAGNOSIS — F329 Major depressive disorder, single episode, unspecified: Secondary | ICD-10-CM | POA: Insufficient documentation

## 2013-05-27 DIAGNOSIS — R062 Wheezing: Secondary | ICD-10-CM | POA: Insufficient documentation

## 2013-05-27 DIAGNOSIS — F411 Generalized anxiety disorder: Secondary | ICD-10-CM | POA: Insufficient documentation

## 2013-05-27 DIAGNOSIS — J0100 Acute maxillary sinusitis, unspecified: Secondary | ICD-10-CM | POA: Insufficient documentation

## 2013-05-27 DIAGNOSIS — M2550 Pain in unspecified joint: Secondary | ICD-10-CM | POA: Insufficient documentation

## 2013-05-27 DIAGNOSIS — IMO0002 Reserved for concepts with insufficient information to code with codable children: Secondary | ICD-10-CM | POA: Insufficient documentation

## 2013-05-27 DIAGNOSIS — M1990 Unspecified osteoarthritis, unspecified site: Secondary | ICD-10-CM | POA: Insufficient documentation

## 2013-05-27 DIAGNOSIS — Z79899 Other long term (current) drug therapy: Secondary | ICD-10-CM | POA: Insufficient documentation

## 2013-05-27 LAB — CBC
MCV: 93.4 fL (ref 78.0–100.0)
Platelets: 211 10*3/uL (ref 150–400)
RBC: 3.19 MIL/uL — ABNORMAL LOW (ref 3.87–5.11)
RDW: 12 % (ref 11.5–15.5)
WBC: 7.4 10*3/uL (ref 4.0–10.5)

## 2013-05-27 LAB — BASIC METABOLIC PANEL
CO2: 24 mEq/L (ref 19–32)
Chloride: 103 mEq/L (ref 96–112)
GFR calc Af Amer: >90 mL/min (ref >90)
Potassium: 3.4 mEq/L — ABNORMAL LOW (ref 3.5–5.1)
Sodium: 136 mEq/L (ref 135–145)

## 2013-05-27 MED ORDER — LETROZOLE 2.5 MG PO TABS
2.5000 mg | ORAL_TABLET | Freq: Every day | ORAL | Status: DC
  Administered 2013-05-28: 2.5 mg via ORAL
  Filled 2013-05-27 (×2): qty 1

## 2013-05-27 MED ORDER — OXYCODONE-ACETAMINOPHEN 5-325 MG PO TABS: 1.0000 | ORAL_TABLET | ORAL | Status: DC | PRN

## 2013-05-27 MED ORDER — METHOCARBAMOL 500 MG PO TABS: 500.0000 mg | ORAL_TABLET | Freq: Four times a day (QID) | ORAL | Status: DC | PRN

## 2013-05-27 MED ORDER — LETROZOLE 2.5 MG PO TABS
2.5000 mg | ORAL_TABLET | Freq: Once | ORAL | Status: AC
  Administered 2013-05-27: 2.5 mg via ORAL
  Filled 2013-05-27: qty 1

## 2013-05-27 MED ORDER — ASPIRIN 325 MG PO TBEC: 325.0000 mg | DELAYED_RELEASE_TABLET | Freq: Two times a day (BID) | ORAL | Status: DC

## 2013-05-27 NOTE — Progress Notes (Signed)
Physical Therapy Treatment Patient Details Name: [name] MRN: [id number] DOB: 05-06-1956 Today's Date: 05/27/2013 Time: 2956-2130 PT Time Calculation (min): 28 min  PT Assessment / Plan / Recommendation Comments on Treatment Session  Pt progressing with mobility, however with c/o nausea and soreness in hip.     Follow Up Recommendations  Home health PT     Does the patient have the potential to tolerate intense rehabilitation     Barriers to Discharge        Equipment Recommendations  Rolling walker with 5" wheels    Recommendations for Other Services    Frequency 7X/week   Plan Discharge plan remains appropriate    Precautions / Restrictions Precautions Precautions: None Restrictions Weight Bearing Restrictions: No Other Position/Activity Restrictions: WBAT LLE   Pertinent Vitals/Pain 4/10 pain, ice pack applied    Mobility  Bed Mobility Bed Mobility: Sit to Supine Supine to Sit: 4: Min guard;HOB elevated Sit to Supine: 4: Min assist;HOB flat Details for Bed Mobility Assistance: Assist for LLE into bed with cues for adjusting hips once in bed.  Transfers Transfers: Sit to Stand;Stand to Sit Sit to Stand: 4: Min guard;With upper extremity assist;With armrests;From chair/3-in-1 Stand to Sit: 4: Min guard;With upper extremity assist;To bed Details for Transfer Assistance: Min/guard for safety with cues for hand placement and safety as she was somewhat impulsive to stand without RW being in front of her.  Ambulation/Gait Ambulation/Gait Assistance: 4: Min guard Ambulation Distance (Feet): 50 Feet Assistive device: Rolling walker Ambulation/Gait Assistance Details: Cues for sequencing/technique with RW and to maintain upright posture.  Unable to ambulate any further due to c/o nausea.  RN made aware.  Gait Pattern: Step-to pattern;Antalgic;Trunk flexed Gait velocity: decreased    Exercises Total Joint Exercises Ankle Circles/Pumps: AROM;Both;20 reps Quad Sets:  AROM;Left;10 reps Short Arc Quad: AROM;Left;10 reps Heel Slides: AAROM;Left;10 reps Hip ABduction/ADduction: AAROM;Left;10 reps   PT Diagnosis:    PT Problem List:   PT Treatment Interventions:     PT Goals Acute Rehab PT Goals PT Goal Formulation: With patient Time For Goal Achievement: 06/02/13 Potential to Achieve Goals: Good Pt will go Sit to Supine/Side: with supervision PT Goal: Sit to Supine/Side - Progress: Progressing toward goal Pt will go Sit to Stand: with supervision PT Goal: Sit to Stand - Progress: Progressing toward goal Pt will Ambulate: 51 - 150 feet;with supervision;with least restrictive assistive device PT Goal: Ambulate - Progress: Progressing toward goal Pt will Perform Home Exercise Program: with supervision, verbal cues required/provided PT Goal: Perform Home Exercise Program - Progress: Progressing toward goal  Visit Information  Last PT Received On: 05/27/13 Assistance Needed: +1    Subjective Data  Subjective: If I could get rid of this soreness, I'd be fine.  Patient Stated Goal: to return home.    Cognition  Cognition Arousal/Alertness: Awake/alert Behavior During Therapy: WFL for tasks assessed/performed Overall Cognitive Status: Within Functional Limits for tasks assessed    Balance  Balance Balance Assessed: Yes Dynamic Standing Balance Dynamic Standing - Level of Assistance: 4: Min assist  End of Session PT - End of Session Activity Tolerance: Other (comment) (limited by nausea) Patient left: in bed;with call bell/phone within reach Nurse Communication: Mobility status   GP     Vista Deck 05/27/2013, 12:00 PM

## 2013-05-27 NOTE — Evaluation (Signed)
Occupational Therapy Evaluation Patient Details Name: [name] MRN: [id number] DOB: 06-Aug-1956 Today's Date: 05/27/2013 Time: 1914-7829 OT Time Calculation (min): 41 min  OT Assessment / Plan / Recommendation Clinical Impression  Pt is s/p L direct anterior THA and displays some decreased strength and flexibility to perform ADL at independence level. She will benefit from skilled OT services to improve ADL independence for discharge home. Will focus on AE education next visit and 3in1 transfers again. SHe plans to sponge bathe initially.     OT Assessment  Patient needs continued OT Services    Follow Up Recommendations  No OT follow up;Supervision/Assistance - 24 hour    Barriers to Discharge      Equipment Recommendations  3 in 1 bedside comode    Recommendations for Other Services    Frequency  Min 2X/week    Precautions / Restrictions Precautions Precautions: None Restrictions Weight Bearing Restrictions: No Other Position/Activity Restrictions: WBAT LLE   Pertinent Vitals/Pain  Pain at 4/10 and describes as "sore". Nursing aware.   ADL  Eating/Feeding: Simulated;Independent Where Assessed - Eating/Feeding: Chair Grooming: Simulated;Wash/dry hands;Set up Where Assessed - Grooming: Supported sitting Upper Body Bathing: Simulated;Chest;Right arm;Left arm;Abdomen;Set up Where Assessed - Upper Body Bathing: Unsupported sitting Lower Body Bathing: Simulated;Minimal assistance Where Assessed - Lower Body Bathing: Supported sit to stand Upper Body Dressing: Simulated;Set up Where Assessed - Upper Body Dressing: Unsupported sitting Lower Body Dressing: Simulated;Minimal assistance Where Assessed - Lower Body Dressing: Supported sit to stand Toilet Transfer: Performed;Minimal assistance Toilet Transfer Method: Other (comment) (with walker to 3in1) Toilet Transfer Equipment: Raised toilet seat with arms (or 3-in-1 over toilet) Toileting - Clothing Manipulation and Hygiene:  Simulated;Minimal assistance Where Assessed - Engineer, mining and Hygiene: Sit to stand from 3-in-1 or toilet Equipment Used: Rolling walker ADL Comments: Educated pt on all AE uses and she would benefit from practice. She has a female friend staying with her at discharge but she wants to do as much for herself as she can. She needs cues for hand placement during transfers and to roll walker and not pick it up. She states R hip needs to be replaced also.     OT Diagnosis: Generalized weakness  OT Problem List: Decreased strength;Decreased range of motion;Decreased knowledge of use of DME or AE OT Treatment Interventions: Self-care/ADL training;Therapeutic activities;DME and/or AE instruction;Patient/family education   OT Goals Acute Rehab OT Goals OT Goal Formulation: With patient Time For Goal Achievement: 06/03/13 Potential to Achieve Goals: Good ADL Goals Pt Will Perform Grooming: with supervision;Standing at sink ADL Goal: Grooming - Progress: Goal set today Pt Will Perform Lower Body Bathing: with supervision;with adaptive equipment;Sit to stand from chair;Sit to stand from bed ADL Goal: Lower Body Bathing - Progress: Goal set today Pt Will Perform Lower Body Dressing: with supervision;Sit to stand from chair;Sit to stand from bed;with adaptive equipment ADL Goal: Lower Body Dressing - Progress: Goal set today Pt Will Transfer to Toilet: with supervision;Ambulation;3-in-1 ADL Goal: Toilet Transfer - Progress: Goal set today Pt Will Perform Toileting - Clothing Manipulation: with supervision;Standing ADL Goal: Toileting - Clothing Manipulation - Progress: Goal set today  Visit Information  Last OT Received On: 05/27/13 Assistance Needed: +1    Subjective Data  Subjective: I know I need to get up some more Patient Stated Goal: to be independent again   Prior Functioning     Home Living Lives With: Friend(s) Available Help at Discharge: Friend(s);Available 24  hours/day Type of Home: Apartment Home Access: Level  entry Home Layout: One level Bathroom Shower/Tub: Engineer, manufacturing systems: Standard Home Adaptive Equipment: Straight cane;Reacher;Sock aid;Long-handled sponge Additional Comments: pt states her reacher has suction cups so she may purchase the other kind without suction cups for dressing. she will check but thinks she has a sock aid and will possibly purchase if not.  Prior Function Level of Independence: Independent Able to Take Stairs?: Yes Driving: Yes Communication Communication: No difficulties         Vision/Perception     Cognition  Cognition Arousal/Alertness: Awake/alert Behavior During Therapy: WFL for tasks assessed/performed Overall Cognitive Status: Within Functional Limits for tasks assessed    Extremity/Trunk Assessment Right Upper Extremity Assessment RUE ROM/Strength/Tone: [facility] for tasks assessed Left Upper Extremity Assessment LUE ROM/Strength/Tone: WFL for tasks assessed     Mobility Bed Mobility Bed Mobility: Supine to Sit Supine to Sit: 4: Min guard;HOB elevated Transfers Transfers: Sit to Stand;Stand to Sit Sit to Stand: 4: Min guard;With upper extremity assist;From bed;From chair/3-in-1 Stand to Sit: 4: Min assist;With upper extremity assist;To chair/3-in-1 Details for Transfer Assistance: min verbal cues for hand placement and safety     Exercise     Balance Balance Balance Assessed: Yes Dynamic Standing Balance Dynamic Standing - Level of Assistance: 4: Min assist   End of Session OT - End of Session Equipment Utilized During Treatment: Gait belt Activity Tolerance: Patient tolerated treatment well Patient left: in chair;with call bell/phone within reach  GO     Lennox Laity [phone] 05/27/2013, 10:03 AM

## 2013-05-27 NOTE — Care Management Note (Addendum)
    Page 1 of 2   05/28/2013     10:08:58 AM   CARE MANAGEMENT NOTE 05/28/2013  Patient:  [name],[name]   Account Number:  [id number]  Date Initiated:  05/27/2013  Documentation initiated by:  Lanier Clam  Subjective/Objective Assessment:   ADMITTED W/L HIP OA.     Action/Plan:   FROM HOME ALONE.HAS PCP,PHARMACY.   Anticipated DC Date:  05/28/2013   Anticipated DC Plan:  HOME W HOME HEALTH SERVICES      DC Planning Services  CM consult      Choice offered to / List presented to:  C-1 Patient   DME arranged  WALKER - ROLLING  BEDSIDE COMMODE      DME agency  [facility]     HH arranged  HH-2 PT      [facility] agency  [facility]   Status of service:  Completed, signed off Medicare Important Message given?   (If response is "NO", the following Medicare IM given date fields will be blank) Date Medicare IM given:   Date Additional Medicare IM given:    Discharge Disposition:  HOME W HOME HEALTH SERVICES  Per UR Regulation:    If discussed at Long Length of Stay Meetings, dates discussed:    Comments:  05/28/13 Quisha Mabie RN,BSN NCM WEEKEND [phone] PATIENT FOR D/C TODAY HH ORDERS  HHPT-[facility] AWARE SPOKE TO [facility] THEY ARE ABLE TO PULL D/C SUMMARY FROM [facility].DME ALREADY DELIVERED YESTERDAY BY [facility].NO FURTHER D/C NEEDS. 05/27/13 Stasia Somero RN,BSN NCM WEEKEND [phone] 3:50P [facility] BRIAN HAS DELIVERED RW,3N1 TO [facility].PROVIDED HIM W/FACE SHEET,H&P,HT,WT,HOME DME RW,3N1 ORDERS. HHPT,RW,3N1 RECOMMENDED.[facility] CHOSE FOR HHPT.CIGNAIS PATIENT'S HEALTH INSURANCE-[employer]WORK,TC APRIA TEL#[phone], SPOKE TO JULIE-ANS SERVICE WHO STATED THAT OVER WEEKEND IS THE AFTER HOUR DELIVERY SERVICE,& THEREIS NO DELIVERY OF DME UNTIL MONDAY.TC [facility] TEL#[phone], RECEIVED CALL FROM BRIAN CARTER [phone](DRIVER) WHO WILL BE ABLETO SERVICE DME OF RW,3N1 TOMORROW.I WILL CALL HIM IN AM TO CONFIRM D/C,WILL HAVE FACE SHEET,ORDERS FOR RW,3N1.PATIENT IN AGREEMENT.[facility]  TEL#[phone] AWAREOF REFERRAL FOR HHPT, THEY WILL FOLLOW FROM [facility] FOR D/C ORDERS SINCE HHPT ORDER ALREADY PLACED.

## 2013-05-27 NOTE — Progress Notes (Signed)
Physical Therapy Treatment Patient Details Name: [name] MRN: [id number] DOB: [birth year] Today's Date: 05/27/2013 Time: 4098-1191 PT Time Calculation (min): 32 min  PT Assessment / Plan / Recommendation Comments on Treatment Session  Pt continues progressing well and will be ready for D/C tomorrow.     Follow Up Recommendations  Home health PT     Does the patient have the potential to tolerate intense rehabilitation     Barriers to Discharge        Equipment Recommendations  Rolling walker with 5" wheels    Recommendations for Other Services    Frequency 7X/week   Plan Discharge plan remains appropriate    Precautions / Restrictions Precautions Precautions: None Restrictions Weight Bearing Restrictions: No Other Position/Activity Restrictions: WBAT LLE   Pertinent Vitals/Pain 3/10 pain    Mobility  Bed Mobility Bed Mobility: Sit to Supine Sit to Supine: 4: Min assist;HOB flat Details for Bed Mobility Assistance: Assist for LLE into bed with cues for adjusting hips once in bed.  Transfers Transfers: Sit to Stand;Stand to Sit Sit to Stand: With upper extremity assist;With armrests;From chair/3-in-1;5: Supervision Stand to Sit: With upper extremity assist;To bed;5: Supervision Details for Transfer Assistance: Cues for hand placement and safety as she was somewhat impulsive to stand without RW being in front of her.  Ambulation/Gait Ambulation/Gait Assistance: 4: Min guard Ambulation Distance (Feet): 100 Feet Assistive device: Rolling walker Ambulation/Gait Assistance Details: Min cues for upright posture and not stepping too far inside of RW.  Gait Pattern: Step-to pattern;Antalgic;Trunk flexed Gait velocity: decreased    Exercises Total Joint Exercises Ankle Circles/Pumps: AROM;Both;20 reps Quad Sets: AROM;Left;10 reps Short Arc Quad: AROM;Left;10 reps Heel Slides: AAROM;Left;10 reps Hip ABduction/ADduction: AAROM;Left;10 reps   PT Diagnosis:    PT  Problem List:   PT Treatment Interventions:     PT Goals Acute Rehab PT Goals PT Goal Formulation: With patient Time For Goal Achievement: 06/02/13 Potential to Achieve Goals: Good Pt will go Supine/Side to Sit: with supervision PT Goal: Supine/Side to Sit - Progress: Progressing toward goal Pt will go Sit to Supine/Side: with supervision PT Goal: Sit to Supine/Side - Progress: Progressing toward goal Pt will go Sit to Stand: with supervision PT Goal: Sit to Stand - Progress: Met Pt will Ambulate: 51 - 150 feet;with supervision;with least restrictive assistive device PT Goal: Ambulate - Progress: Progressing toward goal Pt will Perform Home Exercise Program: with supervision, verbal cues required/provided PT Goal: Perform Home Exercise Program - Progress: Progressing toward goal  Visit Information  Last PT Received On: 05/27/13 Assistance Needed: +1    Subjective Data  Subjective: If I could get rid of this soreness, I'd be fine.  Patient Stated Goal: to return home.    Cognition  Cognition Arousal/Alertness: Awake/alert Behavior During Therapy: WFL for tasks assessed/performed Overall Cognitive Status: Within Functional Limits for tasks assessed    Balance     End of Session PT - End of Session Activity Tolerance: Other (comment);Patient tolerated treatment well (some dizziness) Patient left: in bed;with call bell/phone within reach;with family/visitor present Nurse Communication: Mobility status   GP     Vista Deck 05/27/2013, 4:33 PM

## 2013-05-27 NOTE — Progress Notes (Signed)
Subjective: 1 Day Post-Op Procedure(s) (LRB): Left TOTAL HIP ARTHROPLASTY ANTERIOR APPROACH (Left) Awake alert and oriented x4. She will have assistance when she goes home with a friend for a week or 2. Foley is still in place expect to remove after mobilizing this morning in physical therapy. May be ready to go home tomorrow. Patient reports pain as 4 on 0-10 scale.    Objective: Vital signs in last 24 hours: Temp:  [97.4 F (36.3 C)-98.8 F (37.1 C)] 97.6 F (36.4 C) (06/07 0549) Pulse Rate:  [57-96] 66 (06/07 0549) Resp:  [12-23] 16 (06/07 0549) BP: (92-105)/(45-66) 102/66 mmHg (06/07 0549) SpO2:  [96 %-100 %] 100 % (06/07 0549) Weight:  [58.06 kg (128 lb)] 58.06 kg (128 lb) (06/06 1410)  Intake/Output from previous day: 06/06 0701 - 06/07 0700 In: 3586.3 [P.O.:600; I.V.:2936.3; IV Piggyback:50] Out: 3350 [Urine:3000; Blood:350] Intake/Output this shift: Total I/O In: 320 [P.O.:320] Out: -    Recent Labs  05/27/13 0810  HGB 10.0*    Recent Labs  05/27/13 0810  WBC 7.4  RBC 3.19*  HCT 29.8*  PLT 211   No results found for this basename: NA, K, CL, CO2, BUN, CREATININE, GLUCOSE, CALCIUM,  in the last 72 hours No results found for this basename: LABPT, INR,  in the last 72 hours  Neurologically intact ABD soft Neurovascular intact Sensation intact distally Intact pulses distally Dorsiflexion/Plantar flexion intact Incision: dressing C/D/I  Assessment/Plan: 1 Day Post-Op Procedure(s) (LRB): Left TOTAL HIP ARTHROPLASTY ANTERIOR APPROACH (Left)  Advance diet Up with therapy Plan for discharge tomorrow Discharge home with home health  Rafeal Skibicki E 05/27/2013, 8:58 AM

## 2013-05-28 DIAGNOSIS — D62 Acute posthemorrhagic anemia: Secondary | ICD-10-CM | POA: Diagnosis not present

## 2013-05-28 LAB — CBC
HCT: 29.5 % — ABNORMAL LOW (ref 36.0–46.0)
MCHC: 33.9 g/dL (ref 30.0–36.0)
Platelets: 213 10*3/uL (ref 150–400)
RDW: 12.1 % (ref 11.5–15.5)
WBC: 6.5 10*3/uL (ref 4.0–10.5)

## 2013-05-28 MED ORDER — PROMETHAZINE HCL 25 MG PO TABS: 25.0000 mg | ORAL_TABLET | Freq: Four times a day (QID) | ORAL | Status: DC | PRN

## 2013-05-28 NOTE — Progress Notes (Signed)
Occupational Therapy Treatment and Discharge Patient Details Name: Tanairi Cypert MRN: [id number] DOB: September 01, 1956 Today's Date: 05/28/2013 Time: 4098-1191 OT Time Calculation (min): 26 min  OT Assessment / Plan / Recommendation Comments on Treatment Session This 56 you making excellent progress, all education completed, will sign off.    Follow Up Recommendations  No OT follow up;Supervision - Intermittent       Equipment Recommendations  3 in 1 bedside comode       Frequency Min 2X/week   Plan Discharge plan remains appropriate    Precautions / Restrictions Precautions Precautions: None Restrictions Weight Bearing Restrictions: No Other Position/Activity Restrictions: WBAT LLE   Pertinent Vitals/Pain 3/10 left hip    ADL  Lower Body Dressing: Performed;Set up (with AE) Where Assessed - Lower Body Dressing: Unsupported sit to stand Toilet Transfer: Therapist, sports:  (Bed>recliner>bed) Transfers/Ambulation Related to ADLs: Mod I sit to stand and stand to sit; S for ambulation ADL Comments: Pt bought leg lifter and sock aid. Educated pt on how to use leg lifter to get OOB and pt practiced this      OT Goals ADL Goals ADL Goal: Lower Body Dressing - Progress: Progressing toward goals ADL Goal: Toilet Transfer - Progress: Met  Visit Information  Last OT Received On: 05/28/13 Assistance Needed: +1          Cognition  Cognition Arousal/Alertness: Awake/alert Behavior During Therapy: WFL for tasks assessed/performed Overall Cognitive Status: Within Functional Limits for tasks assessed    Mobility  Bed Mobility Bed Mobility: Supine to Sit;Sitting - Scoot to Edge of Bed Supine to Sit: 6: Modified independent (Device/Increase time);HOB flat (with leg lifter) Sitting - Scoot to Edge of Bed: 6: Modified independent (Device/Increase time) Transfers Transfers: Sit to Stand;Stand to Sit Sit to Stand: 6: Modified independent  (Device/Increase time);With upper extremity assist;From bed Stand to Sit: 6: Modified independent (Device/Increase time);With upper extremity assist;With armrests;To chair/3-in-1          End of Session OT - End of Session Equipment Utilized During Treatment:  (RW) Activity Tolerance: Patient tolerated treatment well Patient left:  (with PT sitting EOB) Nurse Communication:  (would like nausea medicine before she leaves)       Evette Georges [phone] 05/28/2013, 8:59 AM

## 2013-05-28 NOTE — Progress Notes (Signed)
Assessment unchanged. Pt verbalized understanding of dc instructions as well as My Chart through teach back. Scripts x 3 given to pt by Dr. Otelia Sergeant. Pt has walker and DME required for home, and Home Health in place per CM. Pt discharged via wheelchair to front entrance to meet awaiting vehicle to carry home. Accompanied by daughter and NT.

## 2013-05-28 NOTE — Discharge Summary (Signed)
Patient ID: [name] MRN: [id number] DOB/AGE: [birth year] 57 y.o.  Admit date: 05/26/2013 Discharge date: 05/28/2013  Admission Diagnoses:  Principal Problem:   Degenerative arthritis of hip Active Problems:   Postoperative anemia due to acute blood loss   Discharge Diagnoses:  Same  Past Medical History  Diagnosis Date  . PONV (postoperative nausea and vomiting)   . Bronchitis 05-12-13    occ. bouts with bronchitis  . Recurrent sinus infections     last  2 months ago  . History of frequent urinary tract infections     last tx. 1 month ago  . Benign meningioma 05-12-13    dx. '98- small, no problems  . Cancer 05-12-13    left breast cancer    Surgeries: Procedure(s): Left TOTAL HIP ARTHROPLASTY ANTERIOR APPROACH on 05/26/2013   Consultants:    Discharged Condition: Improved  Hospital Course: [name] is an 57 y.o. female who was admitted 05/26/2013 for operative treatment ofDegenerative arthritis of hip. Patient has severe unremitting pain that affects sleep, daily activities, and work/hobbies. After pre-op clearance the patient was taken to the operating room on 05/26/2013 and underwent  Procedure(s): Left TOTAL HIP ARTHROPLASTY ANTERIOR APPROACH.    Patient was given perioperative antibiotics: Anti-infectives   Start     Dose/Rate Route Frequency Ordered Stop   05/26/13 1600  ceFAZolin (ANCEF) IVPB 1 g/50 mL premix     1 g 100 mL/hr over 30 Minutes Intravenous Every 6 hours 05/26/13 1411 05/26/13 2255   05/26/13 0714  ceFAZolin (ANCEF) IVPB 2 g/50 mL premix     2 g 100 mL/hr over 30 Minutes Intravenous On call to O.R. 05/26/13 0714 05/26/13 1004       Patient was given sequential compression devices, early ambulation, and chemoprophylaxis to prevent DVT.  Patient benefited maximally from hospital stay and there were no complications.    Recent vital signs: Patient Vitals for the past 24 hrs:  BP Temp Temp src Pulse Resp SpO2  05/28/13 0532 103/50 mmHg 99.3 F  (37.4 C) Oral 86 18 96 %  05/27/13 2140 97/49 mmHg 99.9 F (37.7 C) Oral 85 16 97 %     Recent laboratory studies:  Recent Labs  05/27/13 0810 05/28/13 0437  WBC 7.4 6.5  HGB 10.0* 10.0*  HCT 29.8* 29.5*  PLT 211 213  NA 136  --   K 3.4*  --   CL 103  --   CO2 24  --   BUN 7  --   CREATININE 0.64  --   GLUCOSE 122*  --   CALCIUM 8.7  --      Discharge Medications:     Medication List    STOP taking these medications       meloxicam 15 MG tablet  Commonly known as:  MOBIC      TAKE these medications       alendronate 70 MG tablet  Commonly known as:  FOSAMAX  Take 70 mg by mouth every Sunday. Take with a full glass of water on an empty stomach.     aspirin 325 MG EC tablet  Take 1 tablet (325 mg total) by mouth 2 (two) times daily after a meal.     calcium carbonate 600 MG Tabs  Commonly known as:  OS-CAL  Take 600 mg by mouth daily.     Fish Oil 1200 MG Caps  Take 1,200 mg by mouth daily.     letrozole 2.5 MG tablet  Commonly known  asCain Sieve  Take 2.5 mg by mouth every morning.     methocarbamol 500 MG tablet  Commonly known as:  ROBAXIN  Take 1 tablet (500 mg total) by mouth every 6 (six) hours as needed.     oxyCODONE-acetaminophen 5-325 MG per tablet  Commonly known as:  ROXICET  Take 1-2 tablets by mouth every 4 (four) hours as needed for pain.     promethazine 25 MG tablet  Commonly known as:  PHENERGAN  Take 1 tablet (25 mg total) by mouth every 6 (six) hours as needed for nausea.     vitamin C 500 MG tablet  Commonly known as:  ASCORBIC ACID  Take 500 mg by mouth daily.        Diagnostic Studies: Dg Chest 2 View  05/12/2013   *RADIOLOGY REPORT*  Clinical Data: Preoperative respiratory examination for left total hip arthroplasty.  CHEST - 2 VIEW  Comparison: Portable examination 03/26/2010.  Findings: Port-A-Cath has been removed in the interval.  The heart size and mediastinal contours are stable.  There is improved aeration of the  lungs which are clear.  There are no acute osseous findings.  The patient appears to be status post left mastectomy.  IMPRESSION: No acute cardiopulmonary process.  Suspected mild chronic obstructive pulmonary disease.   Original Report Authenticated By: Carey Bullocks, M.D.   Dg Hip Complete Left  05/26/2013   *RADIOLOGY REPORT*  Clinical Data: Left hip replacement.  LEFT HIP - COMPLETE 2+ VIEW  Comparison: No priors.  Findings: Two intraoperative fluoroscopic views of the left hip demonstrate postoperative changes of left total hip arthroplasty. The femoral and acetabular components appear properly seated without definite periprosthetic fracture or other acute abnormality on these fluoroscopic images.  The femoral head projects within the acetabulum on this two-view examination.  IMPRESSION: 1.  Postoperative changes of left total hip arthroplasty, as above.   Original Report Authenticated By: Trudie Reed, M.D.   Dg Pelvis Portable  05/26/2013   *RADIOLOGY REPORT*  Clinical Data: Postop left hip surgery  PORTABLE PELVIS  Comparison: 05/15/2011  Findings: Left total hip arthroplasty in satisfactory position on this single frontal view.  Associated subcutaneous gas.  Moderate degenerative changes of the right hip.  Visualized bony pelvis appears intact.  Mild degenerative changes of the lower lumbar spine.  IMPRESSION: Left total hip arthroplasty in satisfactory position.   Original Report Authenticated By: Charline Bills, M.D.   Dg Hip Portable 1 View Left  05/26/2013   *RADIOLOGY REPORT*  Clinical Data: Status post left hip arthroplasty.  PORTABLE LEFT HIP - 1 VIEW  Comparison: Intraoperative imaging earlier today.  Findings: Cross-table lateral view shows normal alignment of the left hip arthroplasty.  No fracture or abnormal lucency is identified.  IMPRESSION: Normal alignment of the left hip arthroplasty in the lateral projection.   Original Report Authenticated By: Irish Lack, M.D.   Dg C-arm  1-60 Min-no Report  05/26/2013   CLINICAL DATA: Surgery   C-ARM 1-60 MINUTES  Fluoroscopy was utilized by the requesting physician.  No radiographic  interpretation.     Disposition: 01-Home or Self Care      Discharge Orders   Future Orders Complete By Expires     Call MD / Call 911  As directed     Comments:      If you experience chest pain or shortness of breath, CALL 911 and be transported to the hospital emergency room.  If you develope a fever above 101 F,  pus (white drainage) or increased drainage or redness at the wound, or calf pain, call your surgeon's office.    Call MD / Call 911  As directed     Comments:      If you experience chest pain or shortness of breath, CALL 911 and be transported to the hospital emergency room.  If you develope a fever above 101 F, pus (white drainage) or increased drainage or redness at the wound, or calf pain, call your surgeon's office.    Call MD / Call 911  As directed     Comments:      If you experience chest pain or shortness of breath, CALL 911 and be transported to the hospital emergency room.  If you develope a fever above 101 F, pus (white drainage) or increased drainage or redness at the wound, or calf pain, call your surgeon's office.    Constipation Prevention  As directed     Comments:      Drink plenty of fluids.  Prune juice may be helpful.  You may use a stool softener, such as Colace (over the counter) 100 mg twice a day.  Use MiraLax (over the counter) for constipation as needed.    Constipation Prevention  As directed     Comments:      Drink plenty of fluids.  Prune juice may be helpful.  You may use a stool softener, such as Colace (over the counter) 100 mg twice a day.  Use MiraLax (over the counter) for constipation as needed.    Constipation Prevention  As directed     Comments:      Drink plenty of fluids.  Prune juice may be helpful.  You may use a stool softener, such as Colace (over the counter) 100 mg twice a day.  Use  MiraLax (over the counter) for constipation as needed.    Diet - low sodium heart healthy  As directed     Diet - low sodium heart healthy  As directed     Diet - low sodium heart healthy  As directed     Discharge instructions  As directed     Comments:      Keep hip incision dry for 5 days post op then may wet while bathing. Therapy daily . Call if fever or chills or increased drainage. Go to ER if acutely short of breath or call for ambulance. Return for follow up in 2 weeks. May full weight bear on the surgical leg unless told otherwise. In house walking for first 2 weeks.    Discharge instructions  As directed     Comments:      Increase your activities as comfort allows. Ice as needed for swelling. You can get your actual dressing wet in the shower. Leave your current dressing in place until Friday 06/02/13; then remove it and get your actual incision wet in the shower    Driving restrictions  As directed     Comments:      No driving for 3 weeks    Driving restrictions  As directed     Comments:      No driving for 4 weeks    Follow the hip precautions as taught in Physical Therapy  As directed     Follow the hip precautions as taught in Physical Therapy  As directed     Increase activity slowly as tolerated  As directed     Increase activity slowly as tolerated  As  directed     Increase activity slowly as tolerated  As directed     Lifting restrictions  As directed     Comments:      No lifting for 6 weeks    Lifting restrictions  As directed     Comments:      No lifting for 6 weeks       Follow-up Information   Follow up with Kathryne Hitch, MD In 2 weeks.   Contact information:   [address] [place] [address] [phone]        Signed: Kathryne Hitch 05/28/2013, 4:28 PM

## 2013-05-28 NOTE — Progress Notes (Signed)
Subjective: 2 Days Post-Op Procedure(s) (LRB): Left TOTAL HIP ARTHROPLASTY ANTERIOR APPROACH (Left) Awake, alert and oriented x4. In good spirits wants to go ahead and go home followup with home health and with Dr. Magnus Ivan in 2 weeks. Nausea associated with use pain medicines and some slight lightheaded this with this. Patient reports pain as 3 on 0-10 scale.    Objective: Vital signs in last 24 hours: Temp:  [98.3 F (36.8 C)-99.9 F (37.7 C)] 99.3 F (37.4 C) (06/08 0532) Pulse Rate:  [84-86] 86 (06/08 0532) Resp:  [16-18] 18 (06/08 0532) BP: (97-103)/(49-60) 103/50 mmHg (06/08 0532) SpO2:  [96 %-100 %] 96 % (06/08 0532)  Intake/Output from previous day: 06/07 0701 - 06/08 0700 In: 1864.3 [P.O.:1100; I.V.:764.3] Out: 2025 [Urine:2025] Intake/Output this shift:     Recent Labs  05/27/13 0810 05/28/13 0437  HGB 10.0* 10.0*    Recent Labs  05/27/13 0810 05/28/13 0437  WBC 7.4 6.5  RBC 3.19* 3.14*  HCT 29.8* 29.5*  PLT 211 213    Recent Labs  05/27/13 0810  NA 136  K 3.4*  CL 103  CO2 24  BUN 7  CREATININE 0.64  GLUCOSE 122*  CALCIUM 8.7   No results found for this basename: LABPT, INR,  in the last 72 hours  Neurologically intact ABD soft Neurovascular intact Sensation intact distally Intact pulses distally Incision: dressing C/D/I  Assessment/Plan: 2 Days Post-Op Procedure(s) (LRB): Left TOTAL HIP ARTHROPLASTY ANTERIOR APPROACH (Left) Anemia acute blood loss  Up with therapy Discharge home with home health  NITKA,JAMES E 05/28/2013, 9:18 AM

## 2013-05-28 NOTE — Progress Notes (Signed)
Physical Therapy Treatment Patient Details Name: [name] MRN: [id number] DOB: [birth year] Today's Date: 05/28/2013 Time: 4098-1191 PT Time Calculation (min): 25 min  PT Assessment / Plan / Recommendation Comments on Treatment Session  Pt progressing well and is now able to tolerate step through gait pattern.  Ready for D//c    Follow Up Recommendations  Home health PT     Does the patient have the potential to tolerate intense rehabilitation     Barriers to Discharge        Equipment Recommendations  Rolling walker with 5" wheels    Recommendations for Other Services    Frequency 7X/week   Plan Discharge plan remains appropriate    Precautions / Restrictions Precautions Precautions: None Restrictions Weight Bearing Restrictions: No Other Position/Activity Restrictions: WBAT LLE   Pertinent Vitals/Pain 4/10 pain    Mobility  Bed Mobility Bed Mobility: Supine to Sit;Sit to Supine Supine to Sit: 6: Modified independent (Device/Increase time);HOB flat Sitting - Scoot to Edge of Bed: 6: Modified independent (Device/Increase time) Sit to Supine: 6: Modified independent (Device/Increase time);HOB flat Details for Bed Mobility Assistance: Pt able to get LLE into and out of bed on her own today without use of leg lifter.  Transfers Transfers: Sit to Stand;Stand to Sit Sit to Stand: 6: Modified independent (Device/Increase time) Stand to Sit: 6: Modified independent (Device/Increase time);With upper extremity assist;With armrests;To chair/3-in-1 Ambulation/Gait Ambulation/Gait Assistance: 5: Supervision Ambulation Distance (Feet): 150 Feet Assistive device: Rolling walker Ambulation/Gait Assistance Details: Cues for transitioning to step through gait pattern. Pt able to tolerate well with cues for increased stride length.  Gait Pattern: Step-to pattern;Antalgic;Trunk flexed Gait velocity: decreased Stairs: No    Exercises Total Joint Exercises Ankle Circles/Pumps:  AROM;Both;20 reps Quad Sets: AROM;Left;10 reps Short Arc Quad: AROM;Left;10 reps Heel Slides: AAROM;Left;10 reps Hip ABduction/ADduction: AAROM;Left;10 reps   PT Diagnosis:    PT Problem List:   PT Treatment Interventions:     PT Goals Acute Rehab PT Goals PT Goal Formulation: With patient Time For Goal Achievement: 06/02/13 Potential to Achieve Goals: Good Pt will go Supine/Side to Sit: with supervision PT Goal: Supine/Side to Sit - Progress: Met Pt will go Sit to Supine/Side: with supervision PT Goal: Sit to Supine/Side - Progress: Met Pt will go Sit to Stand: with supervision PT Goal: Sit to Stand - Progress: Met Pt will Ambulate: 51 - 150 feet;with supervision;with least restrictive assistive device PT Goal: Ambulate - Progress: Met Pt will Perform Home Exercise Program: with supervision, verbal cues required/provided PT Goal: Perform Home Exercise Program - Progress: Met  Visit Information  Last PT Received On: 05/28/13 Assistance Needed: +1    Subjective Data  Subjective: The soreness is already getting better.  Patient Stated Goal: to return home.    Cognition  Cognition Arousal/Alertness: Awake/alert Behavior During Therapy: WFL for tasks assessed/performed Overall Cognitive Status: Within Functional Limits for tasks assessed    Balance     End of Session PT - End of Session Activity Tolerance: Patient tolerated treatment well;Other (comment) (some nausea) Patient left: in chair;with call bell/phone within reach;with family/visitor present Nurse Communication: Mobility status   GP     Vista Deck 05/28/2013, 10:49 AM

## 2013-05-29 ENCOUNTER — Encounter (HOSPITAL_COMMUNITY): Payer: Self-pay | Admitting: Orthopaedic Surgery

## 2013-06-07 ENCOUNTER — Other Ambulatory Visit (HOSPITAL_COMMUNITY): Payer: Self-pay | Admitting: Orthopaedic Surgery

## 2013-06-28 ENCOUNTER — Encounter (HOSPITAL_COMMUNITY): Payer: Self-pay | Admitting: Pharmacy Technician

## 2013-07-03 ENCOUNTER — Encounter (HOSPITAL_COMMUNITY): Payer: Self-pay

## 2013-07-03 ENCOUNTER — Encounter (HOSPITAL_COMMUNITY)
Admission: RE | Admit: 2013-07-03 | Discharge: 2013-07-03 | Disposition: A | Discharge status: Home / Self Care | Payer: Managed Care, Other (non HMO) | Source: Ambulatory Visit | Attending: Orthopaedic Surgery | Admitting: Orthopaedic Surgery

## 2013-07-03 HISTORY — DX: Personal history of antineoplastic chemotherapy: Z92.21

## 2013-07-03 LAB — BASIC METABOLIC PANEL
BUN: 14 mg/dL (ref 6–23)
Calcium: 9.7 mg/dL (ref 8.4–10.5)
Creatinine, Ser: 0.57 mg/dL (ref 0.50–1.10)
GFR calc Af Amer: >90 mL/min (ref >90)
GFR calc non Af Amer: >90 mL/min (ref >90)
Glucose, Bld: 83 mg/dL (ref 70–99)
Potassium: 3.6 mEq/L (ref 3.5–5.1)

## 2013-07-03 LAB — CBC
Hemoglobin: 12.8 g/dL (ref 12.0–15.0)
MCH: 31.9 pg (ref 26.0–34.0)
MCHC: 33.6 g/dL (ref 30.0–36.0)
RDW: 12.5 % (ref 11.5–15.5)

## 2013-07-03 LAB — URINALYSIS, ROUTINE W REFLEX MICROSCOPIC
Glucose, UA: NEGATIVE mg/dL
Ketones, ur: NEGATIVE mg/dL
Leukocytes, UA: NEGATIVE
Protein, ur: NEGATIVE mg/dL
Urobilinogen, UA: 0.2 mg/dL (ref 0.0–1.0)

## 2013-07-03 LAB — SURGICAL PCR SCREEN: MRSA, PCR: NEGATIVE

## 2013-07-03 LAB — APTT: aPTT: 30 seconds (ref 24–37)

## 2013-07-03 LAB — PROTIME-INR: INR: 0.91 (ref 0.00–1.49)

## 2013-07-03 NOTE — Progress Notes (Signed)
Chest x-ray 05/12/13 on [facility]

## 2013-07-03 NOTE — Patient Instructions (Addendum)
20 [name]  07/03/2013   Your procedure is scheduled on: 07/07/13  Report to [facility] at 5:15 AM.  Call this number if you have problems the morning of surgery [phone]: [phone]   Remember:   Do not eat food or drink liquids After Midnight.      Do not wear jewelry, make-up or nail polish.  Do not wear lotions, powders, or perfumes. You may wear deodorant.  Do not shave 48 hours prior to surgery. Men may shave face and neck.  Do not bring valuables to the hospital.  Contacts, dentures or bridgework may not be worn into surgery.  Leave suitcase in the car. After surgery it may be brought to your room.  For patients admitted to the hospital, checkout time is 11:00 AM the day of discharge.    Please read over the following fact sheets that you were given: MRSA Information, blood fact sheet Birdie Sons, RN  pre op nurse call if needed [phone]    FAILURE TO FOLLOW THESE INSTRUCTIONS MAY RESULT IN CANCELLATION OF YOUR SURGERY   Patient Signature: ___________________________________________

## 2013-07-07 ENCOUNTER — Inpatient Hospital Stay (HOSPITAL_COMMUNITY)
Admission: RE | Admit: 2013-07-07 | Discharge: 2013-07-09 | DRG: 470 | Disposition: A | Discharge status: Home Health | Payer: Managed Care, Other (non HMO) | Source: Ambulatory Visit | Attending: Orthopaedic Surgery | Admitting: Orthopaedic Surgery

## 2013-07-07 ENCOUNTER — Encounter (HOSPITAL_COMMUNITY): Payer: Self-pay | Admitting: Anesthesiology

## 2013-07-07 ENCOUNTER — Inpatient Hospital Stay (HOSPITAL_COMMUNITY): Payer: Managed Care, Other (non HMO)

## 2013-07-07 ENCOUNTER — Encounter (HOSPITAL_COMMUNITY): Payer: Self-pay | Admitting: *Deleted

## 2013-07-07 ENCOUNTER — Encounter (HOSPITAL_COMMUNITY)
Admission: RE | Disposition: A | Discharge status: Home Health | Payer: Self-pay | Source: Ambulatory Visit | Attending: Orthopaedic Surgery

## 2013-07-07 ENCOUNTER — Inpatient Hospital Stay (HOSPITAL_COMMUNITY): Payer: Managed Care, Other (non HMO) | Admitting: Anesthesiology

## 2013-07-07 DIAGNOSIS — M1610 Unilateral primary osteoarthritis, unspecified hip: Principal | ICD-10-CM

## 2013-07-07 DIAGNOSIS — Z01812 Encounter for preprocedural laboratory examination: Secondary | ICD-10-CM

## 2013-07-07 DIAGNOSIS — Z853 Personal history of malignant neoplasm of breast: Secondary | ICD-10-CM

## 2013-07-07 DIAGNOSIS — M169 Osteoarthritis of hip, unspecified: Principal | ICD-10-CM | POA: Diagnosis present

## 2013-07-07 HISTORY — PX: TOTAL HIP ARTHROPLASTY: SHX124

## 2013-07-07 LAB — TYPE AND SCREEN
ABO/RH(D): O POS
Antibody Screen: NEGATIVE

## 2013-07-07 SURGERY — ARTHROPLASTY, HIP, TOTAL, ANTERIOR APPROACH
Anesthesia: Spinal | Site: Hip | Laterality: Right | Wound class: Clean

## 2013-07-07 MED ORDER — LETROZOLE 2.5 MG PO TABS
2.5000 mg | ORAL_TABLET | Freq: Every morning | ORAL | Status: DC
  Administered 2013-07-08 – 2013-07-09 (×2): 2.5 mg via ORAL
  Filled 2013-07-07 (×3): qty 1

## 2013-07-07 MED ORDER — ONDANSETRON HCL 4 MG PO TABS
4.0000 mg | ORAL_TABLET | Freq: Four times a day (QID) | ORAL | Status: DC | PRN
  Administered 2013-07-07 (×3): 4 mg via ORAL
  Filled 2013-07-07 (×3): qty 1

## 2013-07-07 MED ORDER — HYDROMORPHONE HCL PF 1 MG/ML IJ SOLN
0.5000 mg | INTRAMUSCULAR | Status: DC | PRN
  Administered 2013-07-07: 0.5 mg via INTRAVENOUS
  Filled 2013-07-07: qty 1

## 2013-07-07 MED ORDER — ACETAMINOPHEN 10 MG/ML IV SOLN
1000.0000 mg | Freq: Once | INTRAVENOUS | Status: DC | PRN
  Filled 2013-07-07: qty 100

## 2013-07-07 MED ORDER — METOCLOPRAMIDE HCL 10 MG PO TABS: 5.0000 mg | ORAL_TABLET | Freq: Three times a day (TID) | ORAL | Status: DC | PRN

## 2013-07-07 MED ORDER — SODIUM CHLORIDE 0.9 % IR SOLN
Status: DC | PRN
  Administered 2013-07-07: 1000 mL

## 2013-07-07 MED ORDER — PROMETHAZINE HCL 25 MG/ML IJ SOLN: 6.2500 mg | INTRAMUSCULAR | Status: DC | PRN

## 2013-07-07 MED ORDER — METOCLOPRAMIDE HCL 5 MG/ML IJ SOLN
5.0000 mg | Freq: Three times a day (TID) | INTRAMUSCULAR | Status: DC | PRN
  Administered 2013-07-07: 10 mg via INTRAVENOUS
  Filled 2013-07-07: qty 2

## 2013-07-07 MED ORDER — ZOLPIDEM TARTRATE 5 MG PO TABS: 5.0000 mg | ORAL_TABLET | Freq: Every evening | ORAL | Status: DC | PRN

## 2013-07-07 MED ORDER — ACETAMINOPHEN 650 MG RE SUPP: 650.0000 mg | Freq: Four times a day (QID) | RECTAL | Status: DC | PRN

## 2013-07-07 MED ORDER — OXYCODONE HCL 5 MG PO TABS
5.0000 mg | ORAL_TABLET | ORAL | Status: DC | PRN
Start: 2013-07-07 — End: 2013-07-09
  Administered 2013-07-07 – 2013-07-09 (×10): 10 mg via ORAL
  Filled 2013-07-07 (×12): qty 2

## 2013-07-07 MED ORDER — EPHEDRINE SULFATE 50 MG/ML IJ SOLN
INTRAMUSCULAR | Status: DC | PRN
  Administered 2013-07-07: 10 mg via INTRAVENOUS
  Administered 2013-07-07: 5 mg via INTRAVENOUS
  Administered 2013-07-07: 10 mg via INTRAVENOUS
  Administered 2013-07-07: 5 mg via INTRAVENOUS

## 2013-07-07 MED ORDER — 0.9 % SODIUM CHLORIDE (POUR BTL) OPTIME
TOPICAL | Status: DC | PRN
  Administered 2013-07-07: 1000 mL

## 2013-07-07 MED ORDER — PROPOFOL INFUSION 10 MG/ML OPTIME
INTRAVENOUS | Status: DC | PRN
  Administered 2013-07-07: 70 ug/kg/min via INTRAVENOUS

## 2013-07-07 MED ORDER — DOCUSATE SODIUM 100 MG PO CAPS
100.0000 mg | ORAL_CAPSULE | Freq: Two times a day (BID) | ORAL | Status: DC
  Administered 2013-07-07 – 2013-07-09 (×5): 100 mg via ORAL

## 2013-07-07 MED ORDER — METHOCARBAMOL 100 MG/ML IJ SOLN
500.0000 mg | Freq: Four times a day (QID) | INTRAMUSCULAR | Status: DC | PRN
  Administered 2013-07-07: 500 mg via INTRAVENOUS
  Filled 2013-07-07 (×2): qty 5

## 2013-07-07 MED ORDER — ONDANSETRON HCL 4 MG/2ML IJ SOLN
4.0000 mg | Freq: Four times a day (QID) | INTRAMUSCULAR | Status: DC | PRN
  Administered 2013-07-08 – 2013-07-09 (×3): 4 mg via INTRAVENOUS
  Filled 2013-07-07 (×3): qty 2

## 2013-07-07 MED ORDER — CEFAZOLIN SODIUM 1-5 GM-% IV SOLN
1.0000 g | Freq: Four times a day (QID) | INTRAVENOUS | Status: AC
  Administered 2013-07-07 (×2): 1 g via INTRAVENOUS
  Filled 2013-07-07 (×3): qty 50

## 2013-07-07 MED ORDER — FENTANYL CITRATE 0.05 MG/ML IJ SOLN
INTRAMUSCULAR | Status: DC | PRN
  Administered 2013-07-07 (×2): 50 ug via INTRAVENOUS

## 2013-07-07 MED ORDER — ALUM & MAG HYDROXIDE-SIMETH 200-200-20 MG/5ML PO SUSP: 30.0000 mL | ORAL | Status: DC | PRN

## 2013-07-07 MED ORDER — MENTHOL 3 MG MT LOZG
1.0000 | LOZENGE | OROMUCOSAL | Status: DC | PRN
  Filled 2013-07-07: qty 9

## 2013-07-07 MED ORDER — ACETAMINOPHEN 325 MG PO TABS: 650.0000 mg | ORAL_TABLET | Freq: Four times a day (QID) | ORAL | Status: DC | PRN

## 2013-07-07 MED ORDER — MEPERIDINE HCL 50 MG/ML IJ SOLN: 6.2500 mg | INTRAMUSCULAR | Status: DC | PRN

## 2013-07-07 MED ORDER — MIDAZOLAM HCL 5 MG/5ML IJ SOLN
INTRAMUSCULAR | Status: DC | PRN
  Administered 2013-07-07: 2 mg via INTRAVENOUS

## 2013-07-07 MED ORDER — HYDROMORPHONE HCL PF 1 MG/ML IJ SOLN
0.2500 mg | INTRAMUSCULAR | Status: DC | PRN
  Administered 2013-07-07 (×2): 0.5 mg via INTRAVENOUS

## 2013-07-07 MED ORDER — TRAMADOL HCL 50 MG PO TABS
100.0000 mg | ORAL_TABLET | Freq: Four times a day (QID) | ORAL | Status: DC | PRN
  Administered 2013-07-09: 100 mg via ORAL
  Filled 2013-07-07 (×2): qty 2

## 2013-07-07 MED ORDER — PHENYLEPHRINE HCL 10 MG/ML IJ SOLN
INTRAMUSCULAR | Status: DC | PRN
  Administered 2013-07-07: 80 ug via INTRAVENOUS

## 2013-07-07 MED ORDER — METHOCARBAMOL 500 MG PO TABS
500.0000 mg | ORAL_TABLET | Freq: Four times a day (QID) | ORAL | Status: DC | PRN
  Administered 2013-07-07 – 2013-07-09 (×4): 500 mg via ORAL
  Filled 2013-07-07 (×4): qty 1

## 2013-07-07 MED ORDER — ADULT MULTIVITAMIN W/MINERALS CH
1.0000 | ORAL_TABLET | Freq: Every day | ORAL | Status: DC
  Administered 2013-07-07 – 2013-07-09 (×3): 1 via ORAL
  Filled 2013-07-07 (×4): qty 1

## 2013-07-07 MED ORDER — CEFAZOLIN SODIUM-DEXTROSE 2-3 GM-% IV SOLR
2.0000 g | INTRAVENOUS | Status: AC
  Administered 2013-07-07: 2 g via INTRAVENOUS

## 2013-07-07 MED ORDER — LACTATED RINGERS IV SOLN
INTRAVENOUS | Status: DC
  Administered 2013-07-07: 09:00:00 via INTRAVENOUS

## 2013-07-07 MED ORDER — BISACODYL 5 MG PO TBEC: 5.0000 mg | DELAYED_RELEASE_TABLET | Freq: Every day | ORAL | Status: DC | PRN

## 2013-07-07 MED ORDER — ONDANSETRON HCL 4 MG/2ML IJ SOLN
INTRAMUSCULAR | Status: DC | PRN
  Administered 2013-07-07: 4 mg via INTRAVENOUS

## 2013-07-07 MED ORDER — LACTATED RINGERS IV SOLN
INTRAVENOUS | Status: DC | PRN
  Administered 2013-07-07 (×2): via INTRAVENOUS

## 2013-07-07 MED ORDER — PHENOL 1.4 % MT LIQD
1.0000 | OROMUCOSAL | Status: DC | PRN
  Filled 2013-07-07: qty 177

## 2013-07-07 MED ORDER — SODIUM CHLORIDE 0.9 % IV SOLN
INTRAVENOUS | Status: DC
  Administered 2013-07-07 – 2013-07-08 (×2): via INTRAVENOUS

## 2013-07-07 MED ORDER — OXYCODONE HCL 5 MG/5ML PO SOLN
5.0000 mg | Freq: Once | ORAL | Status: DC | PRN
  Filled 2013-07-07: qty 5

## 2013-07-07 MED ORDER — KETAMINE HCL 10 MG/ML IJ SOLN
INTRAMUSCULAR | Status: DC | PRN
  Administered 2013-07-07: 20 mg via INTRAVENOUS

## 2013-07-07 MED ORDER — POLYETHYLENE GLYCOL 3350 17 G PO PACK: 17.0000 g | PACK | Freq: Every day | ORAL | Status: DC | PRN

## 2013-07-07 MED ORDER — ASPIRIN EC 325 MG PO TBEC
325.0000 mg | DELAYED_RELEASE_TABLET | Freq: Two times a day (BID) | ORAL | Status: DC
  Administered 2013-07-07 – 2013-07-09 (×4): 325 mg via ORAL
  Filled 2013-07-07 (×6): qty 1

## 2013-07-07 MED ORDER — SCOPOLAMINE 1 MG/3DAYS TD PT72
MEDICATED_PATCH | TRANSDERMAL | Status: DC | PRN
  Administered 2013-07-07: 1 via TRANSDERMAL

## 2013-07-07 MED ORDER — OXYCODONE HCL 5 MG PO TABS: 5.0000 mg | ORAL_TABLET | Freq: Once | ORAL | Status: DC | PRN

## 2013-07-07 MED ORDER — BUPIVACAINE IN DEXTROSE 0.75-8.25 % IT SOLN
INTRATHECAL | Status: DC | PRN
  Administered 2013-07-07: 2 mL via INTRATHECAL

## 2013-07-07 MED ORDER — DIPHENHYDRAMINE HCL 12.5 MG/5ML PO ELIX: 12.5000 mg | ORAL_SOLUTION | ORAL | Status: DC | PRN

## 2013-07-07 SURGICAL SUPPLY — 42 items
BAG ZIPLOCK 12X15 (MISCELLANEOUS) ×4 IMPLANT
BLADE SAW SGTL 18X1.27X75 (BLADE) ×2 IMPLANT
CAPT HIP PF COP ×2 IMPLANT
CELLS DAT CNTRL 66122 CELL SVR (MISCELLANEOUS) ×1 IMPLANT
CLOTH BEACON ORANGE TIMEOUT ST (SAFETY) ×2 IMPLANT
DERMABOND ADVANCED (GAUZE/BANDAGES/DRESSINGS) ×1
DERMABOND ADVANCED .7 DNX12 (GAUZE/BANDAGES/DRESSINGS) ×1 IMPLANT
DRAPE C-ARM 42X120 X-RAY (DRAPES) ×2 IMPLANT
DRAPE STERI IOBAN 125X83 (DRAPES) ×2 IMPLANT
DRAPE U-SHAPE 47X51 STRL (DRAPES) ×6 IMPLANT
DRSG AQUACEL AG ADV 3.5X10 (GAUZE/BANDAGES/DRESSINGS) ×2 IMPLANT
DURAPREP 26ML APPLICATOR (WOUND CARE) ×2 IMPLANT
ELECT BLADE TIP CTD 4 INCH (ELECTRODE) ×2 IMPLANT
ELECT REM PT RETURN 9FT ADLT (ELECTROSURGICAL) ×2
ELECTRODE REM PT RTRN 9FT ADLT (ELECTROSURGICAL) ×1 IMPLANT
FACESHIELD LNG OPTICON STERILE (SAFETY) ×8 IMPLANT
GLOVE BIO SURGEON STRL SZ7.5 (GLOVE) ×2 IMPLANT
GLOVE BIOGEL PI IND STRL 6.5 (GLOVE) ×1 IMPLANT
GLOVE BIOGEL PI IND STRL 8 (GLOVE) ×2 IMPLANT
GLOVE BIOGEL PI INDICATOR 6.5 (GLOVE) ×1
GLOVE BIOGEL PI INDICATOR 8 (GLOVE) ×2
GLOVE ECLIPSE 8.0 STRL XLNG CF (GLOVE) ×2 IMPLANT
GLOVE SURG SS PI 6.5 STRL IVOR (GLOVE) ×2 IMPLANT
GLOVE SURG SS PI 7.5 STRL IVOR (GLOVE) ×6 IMPLANT
GOWN STRL REIN XL XLG (GOWN DISPOSABLE) ×8 IMPLANT
HANDPIECE INTERPULSE COAX TIP (DISPOSABLE) ×1
KIT BASIN OR (CUSTOM PROCEDURE TRAY) ×2 IMPLANT
PACK TOTAL JOINT (CUSTOM PROCEDURE TRAY) ×2 IMPLANT
PADDING CAST COTTON 6X4 STRL (CAST SUPPLIES) ×2 IMPLANT
RTRCTR WOUND ALEXIS 18CM MED (MISCELLANEOUS) ×2
SET HNDPC FAN SPRY TIP SCT (DISPOSABLE) ×1 IMPLANT
SUT ETHIBOND NAB CT1 #1 30IN (SUTURE) ×4 IMPLANT
SUT ETHILON 3 0 PS 1 (SUTURE) ×2 IMPLANT
SUT MNCRL AB 4-0 PS2 18 (SUTURE) ×2 IMPLANT
SUT VIC AB 0 CT1 36 (SUTURE) ×2 IMPLANT
SUT VIC AB 1 CT1 36 (SUTURE) ×4 IMPLANT
SUT VIC AB 2-0 CT1 27 (SUTURE) ×2
SUT VIC AB 2-0 CT1 TAPERPNT 27 (SUTURE) ×2 IMPLANT
TOWEL OR 17X26 10 PK STRL BLUE (TOWEL DISPOSABLE) ×2 IMPLANT
TOWEL OR NON WOVEN STRL DISP B (DISPOSABLE) ×2 IMPLANT
TRAY FOLEY CATH 14FRSI W/METER (CATHETERS) ×2 IMPLANT
WATER STERILE IRR 1500ML POUR (IV SOLUTION) ×4 IMPLANT

## 2013-07-07 NOTE — Transfer of Care (Signed)
Immediate Anesthesia Transfer of Care Note  Patient: [name]  Procedure(s) Performed: Procedure(s): RIGHT TOTAL HIP ARTHROPLASTY ANTERIOR APPROACH (Right)  Patient Location: PACU  Anesthesia Type:Spinal  Level of Consciousness: sedated  Airway & Oxygen Therapy: Patient Spontanous Breathing and Patient connected to face mask oxygen  Post-op Assessment: Report given to PACU RN and Post -op Vital signs reviewed and stable  Post vital signs: Reviewed and stable  Complications: No apparent anesthesia complications

## 2013-07-07 NOTE — Addendum Note (Signed)
Addendum created 07/07/13 1215 by Doran Clay, CRNA   Modules edited: Anesthesia Medication Administration

## 2013-07-07 NOTE — Anesthesia Postprocedure Evaluation (Signed)
Anesthesia Post Note  Patient: [name]  Procedure(s) Performed: Procedure(s) (LRB): RIGHT TOTAL HIP ARTHROPLASTY ANTERIOR APPROACH (Right)  Anesthesia type: Spinal  Patient location: PACU  Post pain: Pain level controlled  Post assessment: Post-op Vital signs reviewed  Last Vitals: BP 89/45  Pulse 60  Temp(Src) 36.4 C (Oral)  Resp 15  SpO2 100%  Post vital signs: Reviewed  Level of consciousness: sedated  Complications: No apparent anesthesia complications

## 2013-07-07 NOTE — Progress Notes (Signed)
Dr. Germeroth in- made aware of patient's blood pressures 

## 2013-07-07 NOTE — Brief Op Note (Signed)
07/07/2013  8:53 AM  PATIENT:  [name]  57 y.o. female  PRE-OPERATIVE DIAGNOSIS:  Severe osteoarthritis right hip  POST-OPERATIVE DIAGNOSIS:  Severe osteoarthritis right hip  PROCEDURE:  Procedure(s): RIGHT TOTAL HIP ARTHROPLASTY ANTERIOR APPROACH (Right)  SURGEON:  Surgeon(s) and Role:    * Kathryne Hitch, MD - Primary  PHYSICIAN ASSISTANT:   Rexene Edison, PA-C  ANESTHESIA:   spinal  EBL:  Total I/O In: 1900 [I.V.:1900] Out: 300 [Blood:300]  BLOOD ADMINISTERED:none  DRAINS: none   LOCAL MEDICATIONS USED:  NONE  SPECIMEN:  No Specimen  DISPOSITION OF SPECIMEN:  N/A  COUNTS:  YES  TOURNIQUET:  * No tourniquets in log *  DICTATION: .Other Dictation: Dictation Number [id number]  PLAN OF CARE: Admit to inpatient   PATIENT DISPOSITION:  PACU - hemodynamically stable.   Delay start of Pharmacological VTE agent (>24hrs) due to surgical blood loss or risk of bleeding: no

## 2013-07-07 NOTE — Progress Notes (Signed)
X-RAY RESULTS NOTED. 

## 2013-07-07 NOTE — Care Management Note (Addendum)
    Page 1 of 1   07/09/2013     2:35:34 PM   CARE MANAGEMENT NOTE 07/09/2013  Patient:  [name],[name]   Account Number:  [id number]  Date Initiated:  07/07/2013  Documentation initiated by:  Lanier Clam  Subjective/Objective Assessment:   ADMITTED W/SEVERE ARTHRTIS R HIP.     Action/Plan:   FROM HOME ALONE.HAS PCP,PHARMACY.   Anticipated DC Date:  07/09/2013   Anticipated DC Plan:  HOME W HOME HEALTH SERVICES      DC Planning Services  CM consult      Choice offered to / List presented to:  C-1 Patient        HH arranged  HH-2 PT      [facility] agency  [facility]   Status of service:  Completed, signed off Medicare Important Message given?   (If response is "NO", the following Medicare IM given date fields will be blank) Date Medicare IM given:   Date Additional Medicare IM given:    Discharge Disposition:  HOME W HOME HEALTH SERVICES  Per UR Regulation:  Reviewed for med. necessity/level of care/duration of stay  If discussed at Long Length of Stay Meetings, dates discussed:    Comments:  07/09/13 Verlie Hellenbrand RN,BSN NCM [phone] TC [facility] TEL#[phone] LEFT VM CONFIRMING D/C TODAY HHPT ALREADY ORDERED,& [facility] WAS ALREADY FOLLOWING.NO DME ORDERED OR NEEDED.  07/07/13 Jeriann Sayres RN,BSN NCM [phone] S/P R THA.[facility] DEBBIE REP ALREADY FOLLOWING-AWARE OF HHPT ORDER.IF DME NEEDED [facility] CAN PROVIDE DME W/[employer] INSURANCE PER KRISTEN [facility] REP.

## 2013-07-07 NOTE — Progress Notes (Signed)
Portable AP Pelvis and AP Right Hip X-rays  Done.

## 2013-07-07 NOTE — Anesthesia Preprocedure Evaluation (Addendum)
Anesthesia Evaluation  Patient identified by MRN, date of birth, ID band Patient awake    Reviewed: Allergy & Precautions, H&P , NPO status , Patient's Chart, lab work & pertinent test results  History of Anesthesia Complications (+) PONV  Airway Mallampati: II TM Distance: >3 FB Neck ROM: Full    Dental no notable dental hx. (+) Dental Advisory Given   Pulmonary neg pulmonary ROS,  breath sounds clear to auscultation  Pulmonary exam normal       Cardiovascular negative cardio ROS  Rhythm:Regular Rate:Normal     Neuro/Psych negative neurological ROS  negative psych ROS   GI/Hepatic negative GI ROS, Neg liver ROS,   Endo/Other  negative endocrine ROS  Renal/GU negative Renal ROS     Musculoskeletal negative musculoskeletal ROS (+)   Abdominal   Peds  Hematology negative hematology ROS (+)   Anesthesia Other Findings   Reproductive/Obstetrics negative OB ROS                           Anesthesia Physical Anesthesia Plan  ASA: II  Anesthesia Plan:    Post-op Pain Management:    Induction: Intravenous  Airway Management Planned:   Additional Equipment:   Intra-op Plan:   Post-operative Plan:   Informed Consent: I have reviewed the patients History and Physical, chart, labs and discussed the procedure including the risks, benefits and alternatives for the proposed anesthesia with the patient or authorized representative who has indicated his/her understanding and acceptance.   Dental advisory given  Plan Discussed with: CRNA  Anesthesia Plan Comments:         Anesthesia Quick Evaluation                                   Anesthesia Evaluation  Patient identified by MRN, date of birth, ID band Patient awake  General Assessment Comment:Cancer 05-12-13 left breast cancer   Reviewed: Allergy & Precautions, H&P , NPO status , Patient's Chart, lab work & pertinent test  results  Airway Mallampati: II TM Distance: >3 FB Neck ROM: Full    Dental no notable dental hx.    Pulmonary neg pulmonary ROS,  breath sounds clear to auscultation  Pulmonary exam normal       Cardiovascular negative cardio ROS  Rhythm:Regular Rate:Normal     Neuro/Psych negative neurological ROS  negative psych ROS   GI/Hepatic negative GI ROS, Neg liver ROS,   Endo/Other  negative endocrine ROS  Renal/GU negative Renal ROS  negative genitourinary   Musculoskeletal negative musculoskeletal ROS (+)   Abdominal   Peds negative pediatric ROS (+)  Hematology negative hematology ROS (+)   Anesthesia Other Findings   Reproductive/Obstetrics negative OB ROS                           Anesthesia Physical Anesthesia Plan  ASA: II  Anesthesia Plan: Spinal   Post-op Pain Management:    Induction:   Airway Management Planned: Nasal Cannula  Additional Equipment:   Intra-op Plan:   Post-operative Plan:   Informed Consent: I have reviewed the patients History and Physical, chart, labs and discussed the procedure including the risks, benefits and alternatives for the proposed anesthesia with the patient or authorized representative who has indicated his/her understanding and acceptance.     Plan Discussed with: CRNA and Surgeon  Anesthesia Plan  Comments:         Anesthesia Quick Evaluation                                   Anesthesia Evaluation  Patient identified by MRN, date of birth, ID band Patient awake  General Assessment Comment:Cancer 05-12-13 left breast cancer   Reviewed: Allergy & Precautions, H&P , NPO status , Patient's Chart, lab work & pertinent test results  Airway Mallampati: II TM Distance: >3 FB Neck ROM: Full    Dental no notable dental hx.    Pulmonary neg pulmonary ROS,  breath sounds clear to auscultation  Pulmonary exam normal       Cardiovascular negative cardio  ROS  Rhythm:Regular Rate:Normal     Neuro/Psych negative neurological ROS  negative psych ROS   GI/Hepatic negative GI ROS, Neg liver ROS,   Endo/Other  negative endocrine ROS  Renal/GU negative Renal ROS  negative genitourinary   Musculoskeletal negative musculoskeletal ROS (+)   Abdominal   Peds negative pediatric ROS (+)  Hematology negative hematology ROS (+)   Anesthesia Other Findings   Reproductive/Obstetrics negative OB ROS                           Anesthesia Physical Anesthesia Plan  ASA: II  Anesthesia Plan: Spinal   Post-op Pain Management:    Induction:   Airway Management Planned: Nasal Cannula  Additional Equipment:   Intra-op Plan:   Post-operative Plan:   Informed Consent: I have reviewed the patients History and Physical, chart, labs and discussed the procedure including the risks, benefits and alternatives for the proposed anesthesia with the patient or authorized representative who has indicated his/her understanding and acceptance.     Plan Discussed with: CRNA and Surgeon  Anesthesia Plan Comments:         Anesthesia Quick Evaluation

## 2013-07-07 NOTE — H&P (Signed)
TOTAL HIP ADMISSION H&P  Patient is admitted for right total hip arthroplasty.  Subjective:  Chief Complaint: right hip pain  HPI: [name], 57 y.o. female, has a history of pain and functional disability in the right hip(s) due to arthritis and patient has failed non-surgical conservative treatments for greater than 12 weeks to include NSAID's and/or analgesics, corticosteriod injections, use of assistive devices and activity modification.  Onset of symptoms was gradual starting 5 years ago with gradually worsening course since that time.The patient noted no past surgery on the right hip(s).  Patient currently rates pain in the right hip at 10 out of 10 with activity. Patient has night pain, worsening of pain with activity and weight bearing, trendelenberg gait, pain that interfers with activities of daily living and pain with passive range of motion. Patient has evidence of subchondral cysts, subchondral sclerosis, periarticular osteophytes and joint space narrowing by imaging studies. This condition presents safety issues increasing the risk of falls.  There is no current active infection.  Patient Active Problem List   Diagnosis Date Noted  . Arthritis pain of hip 07/07/2013  . Postoperative anemia due to acute blood loss 05/28/2013    Class: Acute  . Degenerative arthritis of hip 05/26/2013   Past Medical History  Diagnosis Date  . PONV (postoperative nausea and vomiting)   . Bronchitis 05-12-13    occ. bouts with bronchitis  . Recurrent sinus infections     last  2 months ago  . History of frequent urinary tract infections     last tx. 1 month ago  . Benign meningioma 05-12-13    dx. '98- small, no problems  . Cancer 02/2010    left breast cancer  . History of cancer chemotherapy     Past Surgical History  Procedure Laterality Date  . Tubal ligation    . Cholecystectomy    . Port-acath  05-12-13    insertion and removal  . Total hip arthroplasty Left 05/26/2013    Procedure:  Left TOTAL HIP ARTHROPLASTY ANTERIOR APPROACH;  Surgeon: Kathryne Hitch, MD;  Location: [facility];  Service: Orthopedics;  Laterality: Left;  . Breast surgery Left 2011    '11- left breast mastectomy    Prescriptions prior to admission  Medication Sig Dispense Refill  . letrozole (FEMARA) 2.5 MG tablet Take 2.5 mg by mouth every morning.      . meloxicam (MOBIC) 15 MG tablet Take 15 mg by mouth daily.      . Multiple Vitamin (MULTIVITAMIN WITH MINERALS) TABS Take 1 tablet by mouth daily.      Marland Kitchen alendronate (FOSAMAX) 70 MG tablet Take 70 mg by mouth every Sunday. Take with a full glass of water on an empty stomach.       Allergies  Allergen Reactions  . Other     "narcotics" causes nausea and vomiting    History  Substance Use Topics  . Smoking status: Former Smoker -- 20 years    Types: Cigarettes    Quit date: 05/12/2010  . Smokeless tobacco: Never Used  . Alcohol Use: Yes     Comment: social    History reviewed. No pertinent family history.   Review of Systems  Musculoskeletal: Positive for back pain and joint pain.  All other systems reviewed and are negative.    Objective:  Physical Exam  Constitutional: She is oriented to person, place, and time. She appears well-developed and well-nourished.  HENT:  Head: Normocephalic and atraumatic.  Eyes: EOM  are normal. Pupils are equal, round, and reactive to light.  Neck: Normal range of motion. Neck supple.  Cardiovascular: Normal rate and regular rhythm.   Respiratory: Effort normal and breath sounds normal.  GI: Soft. Bowel sounds are normal.  Musculoskeletal:       Right hip: She exhibits decreased range of motion, decreased strength and bony tenderness.  Neurological: She is alert and oriented to person, place, and time.  Skin: Skin is warm and dry.  Psychiatric: She has a normal mood and affect.    Vital signs in last 24 hours: Temp:  [99 F (37.2 C)] 99 F (37.2 C) (07/18 0541) Pulse Rate:  [67] 67 (07/18  0541) Resp:  [16] 16 (07/18 0541) BP: (116)/(60) 116/60 mmHg (07/18 0541) SpO2:  [100 %] 100 % (07/18 0541)  Labs:   Estimated body mass index is 20.67 kg/(m^2) as calculated from the following:   Height as of 07/03/13: 5\' 6"  (1.676 m).   Weight as of 05/12/13: 58.06 kg (128 lb).   Imaging Review Plain radiographs demonstrate severe degenerative joint disease of the right hip(s). The bone quality appears to be excellent for age and reported activity level.  Assessment/Plan:  End stage arthritis, right hip(s)  The patient history, physical examination, clinical judgement of the provider and imaging studies are consistent with end stage degenerative joint disease of the right hip(s) and total hip arthroplasty is deemed medically necessary. The treatment options including medical management, injection therapy, arthroscopy and arthroplasty were discussed at length. The risks and benefits of total hip arthroplasty were presented and reviewed. The risks due to aseptic loosening, infection, stiffness, dislocation/subluxation,  thromboembolic complications and other imponderables were discussed.  The patient acknowledged the explanation, agreed to proceed with the plan and consent was signed. Patient is being admitted for inpatient treatment for surgery, pain control, PT, OT, prophylactic antibiotics, VTE prophylaxis, progressive ambulation and ADL's and discharge planning.The patient is planning to be discharged home with home health services

## 2013-07-07 NOTE — Evaluation (Signed)
Physical Therapy Evaluation Patient Details Name: [name] MRN: [id number] DOB: 10/12/1956 Today's Date: 07/07/2013 Time: 4098-1191 PT Time Calculation (min): 35 min  PT Assessment / Plan / Recommendation History of Present Illness  s/p DATHA on R on 7/18. Had LTHA  las month.  Clinical Impression  Pt c/o tingling  In R leg and increased pain . Pt was dizzy with nausea when getting up. Returned to bed. Pt plans DC home with friends/family . Continue PT while in acute care.   PT Assessment  Patient needs continued PT services    Follow Up Recommendations  Home health PT;Supervision/Assistance - 24 hour    Does the patient have the potential to tolerate intense rehabilitation      Barriers to Discharge        Equipment Recommendations  None recommended by PT    Recommendations for Other Services     Frequency 7X/week    Precautions / Restrictions Precautions Precautions: None Precaution Comments: dizziness.   Pertinent Vitals/Pain 6/10  R hip.      Mobility  Bed Mobility Bed Mobility: Supine to Sit;Sitting - Scoot to [place] of Bed;Sit to Supine Supine to Sit: 3: Mod assist;With rails;HOB elevated Sitting - Scoot to Edge of Bed: 3: Mod assist Sit to Supine: 3: Mod assist;HOB flat Details for Bed Mobility Assistance: pt required assistance to get RLE to edge of bed , also getting leg onto bed  Transfers Transfers: Sit to Stand;Stand to Sit;Stand Pivot Transfers Sit to Stand: 3: Mod assist;From bed;From chair/3-in-1 Stand to Sit: To chair/3-in-1;To bed;With upper extremity assist;With armrests Stand Pivot Transfers: 3: Mod assist Details for Transfer Assistance: pt had decreased weight on RLE while standing, relates that hip is very sore. Ambulation/Gait Ambulation/Gait Assistance: Not tested (comment) Assistive device: Rolling walker    Exercises     PT Diagnosis: Difficulty walking;Acute pain  PT Problem List: Decreased strength;Decreased range of  motion;Decreased activity tolerance;Decreased mobility;Decreased knowledge of use of DME;Decreased safety awareness;Decreased knowledge of precautions;Pain PT Treatment Interventions: DME instruction;Gait training;Functional mobility training;Therapeutic activities;Therapeutic exercise;Patient/family education     PT Goals(Current goals can be found in the care plan section) Acute Rehab PT Goals Patient Stated Goal: i want this to go as well as my left. PT Goal Formulation: With patient Time For Goal Achievement: 07/14/13 Potential to Achieve Goals: Good  Visit Information  Last PT Received On: 07/07/13 Assistance Needed: +2 (dizzy on eval) History of Present Illness: s/p DATHA on R on 7/18. Had LTHA  las month.       Prior Functioning  Home Living Family/patient expects to be discharged to:: Private residence Living Arrangements: Alone Available Help at Discharge: Friend(s);Available 24 hours/day Type of Home: Apartment Home Access: Level entry Home Layout: One level Home Equipment: Walker - 2 wheels;Cane - single point Prior Function Level of Independence: Independent with assistive device(s) Comments: used SPC prior to admit. Communication Communication: No difficulties    Cognition  Cognition Arousal/Alertness: Awake/alert Behavior During Therapy: WFL for tasks assessed/performed Overall Cognitive Status: Within Functional Limits for tasks assessed    Extremity/Trunk Assessment Upper Extremity Assessment Upper Extremity Assessment: Overall WFL for tasks assessed Lower Extremity Assessment Lower Extremity Assessment: RLE deficits/detail RLE Deficits / Details: decreased hip flexion in supine, assistance required for moving leg off/onto bed.pt reports leg feels tingling . Cervical / Trunk Assessment Cervical / Trunk Assessment: Normal   Balance    End of Session PT - End of Session Activity Tolerance: Patient limited by fatigue;Patient limited by pain (  izzy and  nausea.) Patient left: in bed;with call bell/phone within reach Nurse Communication: Mobility status;Patient requests pain meds  GP     Rada Hay 07/07/2013, 4:05 PM

## 2013-07-07 NOTE — Op Note (Signed)
[name], [name]                 ACCOUNT NO.:  [id number]  MEDICAL RECORD NO.:  [id number]  LOCATION:  [facility]                         FACILITY:  [facility]  PHYSICIAN:  Vanita Panda. Magnus Ivan, M.D.DATE OF BIRTH:  [birth year]  DATE OF PROCEDURE:  07/07/2013 DATE OF DISCHARGE:                              OPERATIVE REPORT   PREOPERATIVE DIAGNOSIS:  Severe end-stage arthritis, degenerative joint disease, right hip.  POSTOPERATIVE DIAGNOSIS:  Severe end-stage arthritis, degenerative joint disease, right hip.  PROCEDURE:  Right total hip arthroplasty direct anterior approach.  IMPLANTS:  DePuy Sector Gription acetabular component size 50, size 32+ 4 neutral polyethylene liner, size 9 Corail femoral component with standard offset, size 32+ 1 ceramic hip ball.  SURGEON:  Doneen Poisson, MD  ASSISTANT:  Richardean Canal, [facility] who is present and assistance was greatly needed throughout the case to accomplish to accomplish good results.  ANESTHESIA:  Spinal.  ANTIBIOTICS:  2 g IV Ancef.  BLOOD LOSS:  300 mL.  COMPLICATIONS:  None.  INDICATIONS:  [name] is a very pleasant 57 year old female with known severe debilitating arthritis of both her hips.  She underwent  a successful left total hip arthroplasty through direct anterior approach 6 weeks ago and now presents for a right hip replacement.  She has done very well over the left side.  The risks and benefits have been explained to her and well understood.  She does wish to proceed with surgery now on the right side.  PROCEDURE DESCRIPTION:  After informed consent was obtained, appropriate right hip was marked.  She was brought to the operating room.  Spinal anesthesia was obtained while she was on a stretcher.  She was laid in supine position.  A Foley catheter was placed and both feet had traction boots applied to them.  She was next placed supine on the Hana fracture table with the perineal post in place and both legs in  inline skeletal traction, but no traction applied.  Her hips were assessed fluoroscopically, so we could obtain her leg lengths and positioning. We then prepped the leg with DuraPrep and sterile drapes including sterile drapes around the fluoroscopic unit.  A time-out was called and she was identified as the correct patient, correct right hip.  We then made an incision just inferior and posterior to the anterior superior iliac spine and dissected down to the tensor fascia lata muscle.  The tensor fascia was then divided longitudinally and proceeded with a direct anterior approach to the hip.  A Cobra retractor was placed around the lateral neck and up underneath the rectus femoris.  Cobra retractor was placed medially.  I cauterized the lateral femoral circumflex vessels.  I then opened up the hip capsule in old type format countering infusion.  We then placed the Cobra retractors within the hip capsule and made my femoral neck cut just proximal to the lesser trochanter with an oscillating saw and completed this with an osteotome. We then placed a corkscrew guide in the femoral head and removed the femoral head in its entirety and found a completely devoid of cartilage and severe cartilage changes in the acetabulum.  We then cleaned the acetabular debris including  remnants of the labrum.  I placed a Bent Hohmann medially and a Cobra retractor laterally.  I then began reaming from a size 42 all the way up to a size 50 and 2 mm increments, but all reamers placed under direct visualization.  The last reamer placed under direct fluoroscopy, so we could obtain her depth of reaming, our inclination, and anteversion.  I then placed the real DePuy Sector Gription acetabular component size 50 and the real 32+ 4 neutral polyethylene liner.  Attention was then turned to the femur with all traction off the leg, femur, and leg rotated to 100 degrees, extended and adducted.  We placed a Mueller  retractor medially and a Hohmann retractor behind the greater trochanter.  I released the lateral joint capsule and then used a box cutting osteotome to enter the femoral canal and a rongeur to lateralize.  I then began broaching from a size 8 broach all the way just to a size 9 and this matched her other side.  We used a calcar planer off of this and then trialed a standard neck and a 32+ 1 hip ball.  We brought the leg back over and up with traction and internal rotation, reduced in the pelvis and it was stable with rotation with minimal shuck and her leg lengths were measured equal on fluoroscopy.  We then dislocated the hip and removed the trial components.  We placed the real Corail femoral component from DePuy size 9 with standard offset and the real 32+ 1 ceramic hip ball.  We again reduced this into the acetabulum stable.  We copiously irrigated the soft tissues with normal saline solution using pulsatile lavage.  We closed the joint capsule with interrupted #1 Ethibond suture followed by running #1 Vicryl in the tensor fascia, 0 Vicryl in the deep tissue, 2-0 Vicryl in the subcutaneous tissue, 4-0 Monocryl subcuticular stitch and Dermabond on the skin.  An Aquacel dressing was applied.  She was taken off of the Hana table to the recovery room in stable condition.  All final counts correct.  There were no complications noted.     Vanita Panda. Magnus Ivan, M.D.     CYB/MEDQ  D:  07/07/2013  T:  07/07/2013  Job:  [id number]

## 2013-07-07 NOTE — Anesthesia Procedure Notes (Signed)
Spinal  Patient location during procedure: OR Start time: 07/07/2013 7:18 AM End time: 07/07/2013 7:22 AM Staffing Anesthesiologist: Lewie Loron R Performed by: anesthesiologist  Preanesthetic Checklist Completed: patient identified, site marked, surgical consent, pre-op evaluation, timeout performed, IV checked, risks and benefits discussed and monitors and equipment checked Spinal Block Patient position: sitting Prep: ChloraPrep Patient monitoring: heart rate, continuous pulse ox and blood pressure Approach: midline Location: L3-4 Injection technique: single-shot Needle Needle type: Sprotte  Needle gauge: 24 G Needle length: 9 cm Assessment Sensory level: T8 Additional Notes Expiration date of kit checked and confirmed. Patient tolerated procedure well, without complications.

## 2013-07-08 LAB — CBC
HCT: 33.5 % — ABNORMAL LOW (ref 36.0–46.0)
Hemoglobin: 11.3 g/dL — ABNORMAL LOW (ref 12.0–15.0)
MCH: 32.2 pg (ref 26.0–34.0)
MCV: 95.4 fL (ref 78.0–100.0)
RBC: 3.51 MIL/uL — ABNORMAL LOW (ref 3.87–5.11)

## 2013-07-08 LAB — BASIC METABOLIC PANEL
CO2: 26 mEq/L (ref 19–32)
Calcium: 8.6 mg/dL (ref 8.4–10.5)
Creatinine, Ser: 0.56 mg/dL (ref 0.50–1.10)
Glucose, Bld: 91 mg/dL (ref 70–99)

## 2013-07-08 MED ORDER — OXYCODONE-ACETAMINOPHEN 5-325 MG PO TABS: 1.0000 | ORAL_TABLET | ORAL | Status: DC | PRN

## 2013-07-08 MED ORDER — METHOCARBAMOL 500 MG PO TABS: 500.0000 mg | ORAL_TABLET | Freq: Three times a day (TID) | ORAL | Status: DC

## 2013-07-08 MED ORDER — ASPIRIN 325 MG PO TBEC: 325.0000 mg | DELAYED_RELEASE_TABLET | Freq: Two times a day (BID) | ORAL | Status: DC

## 2013-07-08 NOTE — Progress Notes (Signed)
Physical Therapy Treatment Patient Details Name: [name] MRN: [id number] DOB: 01-27-1956 Today's Date: 07/08/2013 Time: 2440-1027 PT Time Calculation (min): 37 min  PT Assessment / Plan / Recommendation  PT Comments   Pt continues to have pain and nausea issues as well as some safety concerns, but overall progressing with mobility.   Follow Up Recommendations  Home health PT;Supervision/Assistance - 24 hour     Does the patient have the potential to tolerate intense rehabilitation     Barriers to Discharge        Equipment Recommendations  None recommended by PT    Recommendations for Other Services    Frequency 7X/week   Progress towards PT Goals Progress towards PT goals: Progressing toward goals  Plan Current plan remains appropriate    Precautions / Restrictions Precautions Precautions: None Precaution Comments: dizziness. Restrictions Weight Bearing Restrictions: No Other Position/Activity Restrictions: WBAT   Pertinent Vitals/Pain     Mobility  Bed Mobility Bed Mobility: Supine to Sit;Sit to Supine Supine to Sit: 4: Min assist;HOB elevated Sit to Supine: 4: Min assist;HOB flat Details for Bed Mobility Assistance: Assist for RLE into and out of bed with mod cues for technique and continued breathing for pain control during mobility.  Transfers Transfers: Sit to Stand;Stand to Sit Sit to Stand: 4: Min assist;With upper extremity assist;From bed Stand to Sit: 4: Min assist;With upper extremity assist;To bed Details for Transfer Assistance: Pt continues to have difficulty following cues for scooting closer to EOB and using hands for controlled stand.  She forced herself into standing very unsafely this afternoon.  Max cues to correct.  Ambulation/Gait Ambulation/Gait Assistance: 4: Min guard Ambulation Distance (Feet): 82 Feet Assistive device: Rolling walker Ambulation/Gait Assistance Details: Min cues for sequencing/technique and to maintain upright posture  throughout.  Gait Pattern: Step-to pattern;Decreased stride length;Decreased weight shift to right;Antalgic;Trunk flexed Gait velocity: decreased    Exercises Total Joint Exercises Ankle Circles/Pumps: AROM;Both;20 reps Quad Sets: AROM;Right;10 reps Heel Slides: AAROM;Right;10 reps   PT Diagnosis:    PT Problem List:   PT Treatment Interventions:     PT Goals (current goals can now be found in the care plan section) Acute Rehab PT Goals PT Goal Formulation: With patient Time For Goal Achievement: 07/14/13 Potential to Achieve Goals: Good  Visit Information  Last PT Received On: 07/08/13 Assistance Needed: +1    Subjective Data      Cognition  Cognition Arousal/Alertness: Awake/alert Behavior During Therapy: WFL for tasks assessed/performed Overall Cognitive Status: Within Functional Limits for tasks assessed    Balance     End of Session PT - End of Session Equipment Utilized During Treatment: Gait belt Activity Tolerance: Patient limited by pain Patient left: in bed;with call bell/phone within reach;with family/visitor present Nurse Communication: Mobility status   GP     Vista Deck 07/08/2013, 4:03 PM

## 2013-07-08 NOTE — Progress Notes (Signed)
Patient ID: [name], female   DOB: Nov 17, 1956, 57 y.o.   MRN: [id number] Postoperative day 1 right total hip arthroplasty. Patient is comfortable. Physical therapy today weightbearing as tolerated no restrictions. Possible discharge to home on Sunday.

## 2013-07-08 NOTE — Progress Notes (Signed)
Physical Therapy Treatment Patient Details Name: [name] MRN: [id number] DOB: 01/03/1956 Today's Date: 07/08/2013 Time: 4098-1191 PT Time Calculation (min): 27 min  PT Assessment / Plan / Recommendation  PT Comments   Pt progressing with mobility, however c/o increased pain and demos frustration with "this hip compared to the last time."    Follow Up Recommendations  Home health PT;Supervision/Assistance - 24 hour     Does the patient have the potential to tolerate intense rehabilitation     Barriers to Discharge        Equipment Recommendations  None recommended by PT    Recommendations for Other Services    Frequency 7X/week   Progress towards PT Goals Progress towards PT goals: Progressing toward goals  Plan Current plan remains appropriate    Precautions / Restrictions Precautions Precautions: None Restrictions Weight Bearing Restrictions: No Other Position/Activity Restrictions: WBAT   Pertinent Vitals/Pain 6/10, ice pack applied    Mobility  Bed Mobility Bed Mobility: Supine to Sit Supine to Sit: 4: Min assist;HOB elevated Details for Bed Mobility Assistance: Assist for RLE out of bed with mod cues for technique and continued breathing for pain control during mobility.  Transfers Transfers: Sit to Stand;Stand to Sit Sit to Stand: 4: Min assist;With upper extremity assist;From bed Stand to Sit: 4: Min assist;With upper extremity assist;With armrests;To chair/3-in-1 Details for Transfer Assistance: Pt did not follow cues for scooting to EOB before standing and used momentum to stand, which caused increased pain.  Continue to provide cues for hand placement, technique and LE management.  Ambulation/Gait Ambulation/Gait Assistance: 4: Min assist Ambulation Distance (Feet): 82 Feet Assistive device: Rolling walker Ambulation/Gait Assistance Details: Cues for sequencing/technique with RW, upright posture and increasing step lengths bilaterally.   Gait Pattern:  Step-to pattern;Decreased stride length;Decreased weight shift to right;Antalgic;Trunk flexed Gait velocity: decreased    Exercises     PT Diagnosis:    PT Problem List:   PT Treatment Interventions:     PT Goals (current goals can now be found in the care plan section) Acute Rehab PT Goals PT Goal Formulation: With patient Time For Goal Achievement: 07/14/13 Potential to Achieve Goals: Good  Visit Information  Last PT Received On: 07/08/13 Assistance Needed: +1    Subjective Data  Subjective: I don't remember it hurting this much last time   Cognition  Cognition Arousal/Alertness: Awake/alert Behavior During Therapy: WFL for tasks assessed/performed Overall Cognitive Status: Within Functional Limits for tasks assessed    Balance     End of Session PT - End of Session Equipment Utilized During Treatment: Gait belt Activity Tolerance: Patient limited by pain Patient left: in chair;with call bell/phone within reach Nurse Communication: Mobility status   GP     Vista Deck 07/08/2013, 9:27 AM

## 2013-07-09 LAB — CBC
MCH: 31.4 pg (ref 26.0–34.0)
MCV: 94.2 fL (ref 78.0–100.0)
Platelets: 213 10*3/uL (ref 150–400)
RBC: 3.12 MIL/uL — ABNORMAL LOW (ref 3.87–5.11)
RDW: 12.6 % (ref 11.5–15.5)
WBC: 8.1 10*3/uL (ref 4.0–10.5)

## 2013-07-09 MED ORDER — ONDANSETRON HCL 4 MG PO TABS: 4.0000 mg | ORAL_TABLET | Freq: Three times a day (TID) | ORAL | Status: DC | PRN

## 2013-07-09 MED ORDER — HYDROCODONE-ACETAMINOPHEN 5-325 MG PO TABS: 1.0000 | ORAL_TABLET | Freq: Four times a day (QID) | ORAL | Status: DC | PRN

## 2013-07-09 NOTE — Progress Notes (Signed)
Occupational Therapy Evaluation Patient Details Name: [name] MRN: [id number] DOB: 1956/03/14 Today's Date: 07/09/2013 Time: 4098-1191 OT Time Calculation (min): 18 min  OT Assessment / Plan / Recommendation History of present illness s/p DATHA on R on 7/18. Had LTHA las month.   Clinical Impression   Patient to be discharged home today; all OT education completed.    OT Assessment  Patient does not need any further OT services    Follow Up Recommendations  No OT follow up;Supervision/Assistance - 24 hour    Equipment Recommendations  None recommended by OT;Other (comment) (pt has all needed equipment)    Precautions / Restrictions Precautions Precautions: None Restrictions Weight Bearing Restrictions: No Other Position/Activity Restrictions: WBAT   Pertinent Vitals/Pain     ADL  Eating/Feeding: Performed;Independent Where Assessed - Eating/Feeding: Bed level Grooming: Performed;Wash/dry hands;Wash/dry face;Independent Lower Body Bathing: Simulated;Supervision/safety (with long sponge) Where Assessed - Lower Body Bathing: Supported sitting Lower Body Dressing: Simulated;Supervision/safety;Other (comment) (with reacher, sock aide) Where Assessed - Lower Body Dressing: Supported sitting Toilet Transfer: Other (comment) (declined to practice but has been getting up to BR w/nurses) Toilet Transfer Method: Sit to stand Toilet Transfer Equipment: Raised toilet seat with arms (or 3-in-1 over toilet) Tub/Shower Transfer: Other (comment) (declined to practice but will defer to HHPT to practice) Transfers/Ambulation Related to ADLs: Pt declined to practice due to wanting to rest and wanting pain medications but all OT education reviewed and pt states, "I remember all of that." Pt declined to practice ADL skills stating she had just been through L THA recently and is still using AE, ADL techniques she learned last time. ADL Comments: All education complete regarding ADLs.    OT  Goals(Current goals can be found in the care plan section)    Visit Information  Last OT Received On: 07/09/13 Assistance Needed: +1 History of Present Illness: s/p DATHA on R on 7/18. Had LTHA las month.       Prior Functioning     Home Living Family/patient expects to be discharged to:: Private residence Living Arrangements: Alone Available Help at Discharge: Friend(s);Available 24 hours/day Type of Home: Apartment Home Access: Level entry Home Layout: One level Home Equipment: Walker - 2 wheels;Cane - single point;Bedside commode;Grab bars - tub/shower;Other (comment) (AE for LB self-care (reacher, sock aid, sponge, shoe horn)) Prior Function Level of Independence: Independent with assistive device(s) Comments: used SPC prior to admit. Communication Communication: No difficulties         Vision/Perception     Cognition  Cognition Arousal/Alertness: Awake/alert Behavior During Therapy: WFL for tasks assessed/performed Overall Cognitive Status: Within Functional Limits for tasks assessed    Extremity/Trunk Assessment Upper Extremity Assessment Upper Extremity Assessment: Overall WFL for tasks assessed     End of Session OT - End of Session Activity Tolerance: Patient tolerated treatment well Patient left: in bed;with call bell/phone within reach Nurse Communication: Patient requests pain meds  GO     [name] A 07/09/2013, 9:04 AM

## 2013-07-09 NOTE — Progress Notes (Signed)
Physical Therapy Treatment Patient Details Name: [name] MRN: [id number] DOB: 1956/08/13 Today's Date: 07/09/2013 Time: 8295-6213 PT Time Calculation (min): 40 min  PT Assessment / Plan / Recommendation  PT Comments   Pt progressing well with mobility, however having issues with dizziness and nausea during session.  RN notified.  Vitals stable.   Follow Up Recommendations  Home health PT;Supervision/Assistance - 24 hour     Does the patient have the potential to tolerate intense rehabilitation     Barriers to Discharge        Equipment Recommendations  None recommended by PT    Recommendations for Other Services    Frequency 7X/week   Progress towards PT Goals Progress towards PT goals: Progressing toward goals  Plan Current plan remains appropriate    Precautions / Restrictions Precautions Precautions: None Precaution Comments: dizziness. Restrictions Weight Bearing Restrictions: No Other Position/Activity Restrictions: WBAT   Pertinent Vitals/Pain 4/10, ice pack applied    Mobility  Bed Mobility Bed Mobility: Supine to Sit Supine to Sit: 4: Min guard;HOB flat Details for Bed Mobility Assistance: guarding assist for RLE to EOB and min cues for technique.  Transfers Transfers: Sit to Stand;Stand to Sit Sit to Stand: 5: Supervision;From bed Stand to Sit: 5: Supervision;To chair/3-in-1 Details for Transfer Assistance: Performed several times during session.  Much improved safety and technique today.  Ambulation/Gait Ambulation/Gait Assistance: 5: Supervision (min assist due to dizziness for safety ) Ambulation Distance (Feet): 90 Feet Assistive device: Rolling walker Ambulation/Gait Assistance Details: Min cues for upright posture.  Pt doing very well  but c/o soreness.  Gait Pattern: Step-to pattern;Decreased stride length;Decreased weight shift to right;Antalgic;Trunk flexed Gait velocity: decreased    Exercises Total Joint Exercises Ankle Circles/Pumps:  AROM;Both;20 reps Quad Sets: AROM;Right;10 reps Heel Slides: AROM;Right;10 reps Hip ABduction/ADduction: AAROM;Right;10 reps Long Arc Quad: AROM;Right;10 reps   PT Diagnosis:    PT Problem List:   PT Treatment Interventions:     PT Goals (current goals can now be found in the care plan section) Acute Rehab PT Goals Patient Stated Goal: i want this to go as well as my left. PT Goal Formulation: With patient Time For Goal Achievement: 07/14/13 Potential to Achieve Goals: Good  Visit Information  Last PT Received On: 07/09/13 Assistance Needed: +1    Subjective Data  Subjective: I keep getting hot or cold and dizzy  Patient Stated Goal: i want this to go as well as my left.   Cognition  Cognition Arousal/Alertness: Awake/alert Behavior During Therapy: WFL for tasks assessed/performed Overall Cognitive Status: Within Functional Limits for tasks assessed    Balance     End of Session PT - End of Session Activity Tolerance: Other (comment) (nausea, dizziness) Patient left: in chair;with call bell/phone within reach Nurse Communication: Mobility status   GP     Vista Deck 07/09/2013, 12:19 PM

## 2013-07-09 NOTE — Progress Notes (Signed)
Pt feeling much better, dizziness resolved, and pt wanting to be d/c'd.  Pt stable, scripts, and d/c instructions given to pt with no questions/concerns voiced by pt or family.  Pt transported to private vehicle via wheelchair by NT and family.

## 2013-07-09 NOTE — Progress Notes (Signed)
Spoke with Dr. Lajoyce Corners about pt getting dizzy during therapy.  Advised to monitor pt and if not feeling better plan for d/c home tomorrow.

## 2013-07-09 NOTE — Discharge Summary (Signed)
Physician Discharge Summary  Patient ID: [name] MRN: [id number] DOB/AGE: 55-Jan-1957 55 y.o.  Admit date: 07/07/2013 Discharge date: 07/09/2013  Admission Diagnoses: Osteoarthritis hip  Discharge Diagnoses:  Principal Problem:   Arthritis pain of hip   Discharged Condition: stable  Hospital Course: Patient's hospital course was essentially unremarkable. She underwent total hip arthroplasty. Postoperatively she progressed well and was discharged to home in stable condition.  Consults: None  Significant Diagnostic Studies: labs: Routine labs  Treatments: surgery: See operative note  Discharge Exam: Blood pressure 103/66, pulse 87, temperature 100.4 F (38 C), temperature source Oral, resp. rate 16, height 5\' 6"  (1.676 m), weight 59.421 kg (131 lb), SpO2 94.00%. Incision/Wound: dressing clean dry and intact  Disposition: 06-Home-Health Care Svc  Discharge Orders   Future Orders Complete By Expires     Call MD / Call 911  As directed     Comments:      If you experience chest pain or shortness of breath, CALL 911 and be transported to the hospital emergency room.  If you develope a fever above 101 F, pus (white drainage) or increased drainage or redness at the wound, or calf pain, call your surgeon's office.    Constipation Prevention  As directed     Comments:      Drink plenty of fluids.  Prune juice may be helpful.  You may use a stool softener, such as Colace (over the counter) 100 mg twice a day.  Use MiraLax (over the counter) for constipation as needed.    Diet - low sodium heart healthy  As directed     Discharge wound care:  As directed     Comments:      Keep dressing clean dry and intact until Friday then remove dressing and shower. Apply clean dressing after showering    Increase activity slowly as tolerated  As directed     Weight bearing as tolerated  As directed         Medication List    STOP taking these medications       meloxicam 15 MG tablet   Commonly known as:  MOBIC      TAKE these medications       alendronate 70 MG tablet  Commonly known as:  FOSAMAX  Take 70 mg by mouth every Sunday. Take with a full glass of water on an empty stomach.     aspirin 325 MG EC tablet  Take 1 tablet (325 mg total) by mouth 2 (two) times daily after a meal.     HYDROcodone-acetaminophen 5-325 MG per tablet  Commonly known as:  NORCO  Take 1 tablet by mouth every 6 (six) hours as needed for pain.     letrozole 2.5 MG tablet  Commonly known as:  FEMARA  Take 2.5 mg by mouth every morning.     methocarbamol 500 MG tablet  Commonly known as:  ROBAXIN  Take 1 tablet (500 mg total) by mouth 3 (three) times daily.     multivitamin with minerals Tabs  Take 1 tablet by mouth daily.     ondansetron 4 MG tablet  Commonly known as:  ZOFRAN  Take 1 tablet (4 mg total) by mouth every 8 (eight) hours as needed for nausea.     oxyCODONE-acetaminophen 5-325 MG per tablet  Commonly known as:  ROXICET  Take 1-2 tablets by mouth every 4 (four) hours as needed for pain.           Follow-up Information  Follow up with Kathryne Hitch, MD. Schedule an appointment as soon as possible for a visit in 2 weeks.   Contact information:   [address] [place] [address] [phone]       Signed: Nadara Mustard 07/09/2013, 7:25 AM

## 2013-07-10 ENCOUNTER — Encounter (HOSPITAL_COMMUNITY): Payer: Self-pay | Admitting: Orthopaedic Surgery

## 2013-09-20 IMAGING — CR DG HIP 1V PORT*R*
1 series · 1 of 1 positions shown · non-contrast
Comparison: 05/26/2013.

CLINICAL DATA: Right hip osteoarthritis

PORTABLE PELVIS,PORTABLE RIGHT HIP - 1 VIEW

[AP]
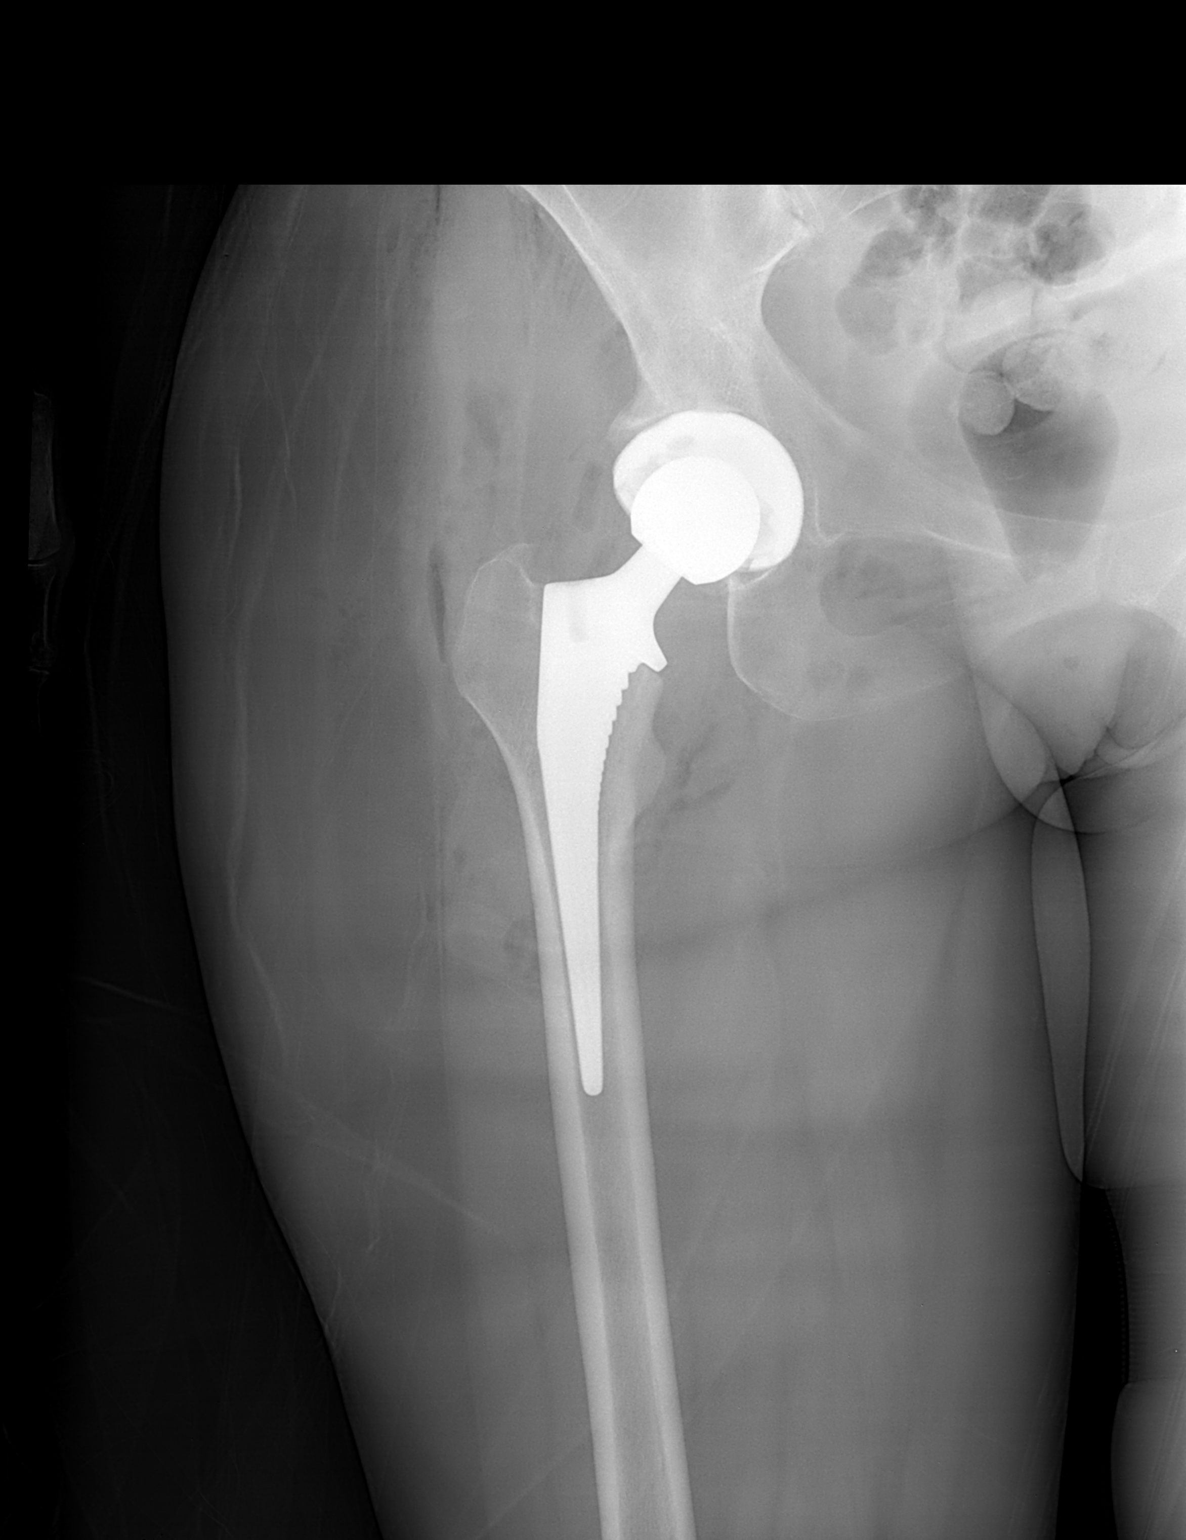

[1 of 1 positions shown; findings below may reference images not displayed]

FINDINGS: Right total hip replacement with satisfactory position
and alignment.
IMPRESSION: No adverse features.

## 2013-11-13 ENCOUNTER — Other Ambulatory Visit: Payer: Self-pay | Admitting: Orthopaedic Surgery

## 2013-11-13 DIAGNOSIS — M542 Cervicalgia: Secondary | ICD-10-CM

## 2013-11-13 DIAGNOSIS — M545 Low back pain: Secondary | ICD-10-CM

## 2013-11-24 ENCOUNTER — Other Ambulatory Visit: Payer: Managed Care, Other (non HMO)

## 2013-11-30 ENCOUNTER — Ambulatory Visit
Admission: RE | Admit: 2013-11-30 | Discharge: 2013-11-30 | Disposition: A | Discharge status: Home / Self Care | Payer: Managed Care, Other (non HMO) | Source: Ambulatory Visit | Attending: Orthopaedic Surgery | Admitting: Orthopaedic Surgery

## 2013-11-30 DIAGNOSIS — M542 Cervicalgia: Secondary | ICD-10-CM

## 2013-11-30 DIAGNOSIS — M545 Low back pain: Secondary | ICD-10-CM

## 2013-12-22 ENCOUNTER — Encounter (HOSPITAL_BASED_OUTPATIENT_CLINIC_OR_DEPARTMENT_OTHER): Payer: Self-pay | Admitting: *Deleted

## 2013-12-27 ENCOUNTER — Other Ambulatory Visit: Payer: Self-pay | Admitting: Plastic Surgery

## 2013-12-27 DIAGNOSIS — C50912 Malignant neoplasm of unspecified site of left female breast: Secondary | ICD-10-CM

## 2013-12-28 ENCOUNTER — Ambulatory Visit (HOSPITAL_BASED_OUTPATIENT_CLINIC_OR_DEPARTMENT_OTHER)
Admission: RE | Admit: 2013-12-28 | Discharge: 2013-12-28 | Disposition: A | Discharge status: Home / Self Care | Payer: Managed Care, Other (non HMO) | Source: Ambulatory Visit | Attending: Plastic Surgery | Admitting: Plastic Surgery

## 2013-12-28 ENCOUNTER — Encounter (HOSPITAL_BASED_OUTPATIENT_CLINIC_OR_DEPARTMENT_OTHER)
Admission: RE | Disposition: A | Discharge status: Home / Self Care | Payer: Self-pay | Source: Ambulatory Visit | Attending: Plastic Surgery

## 2013-12-28 ENCOUNTER — Ambulatory Visit (HOSPITAL_BASED_OUTPATIENT_CLINIC_OR_DEPARTMENT_OTHER): Payer: Managed Care, Other (non HMO) | Admitting: Anesthesiology

## 2013-12-28 ENCOUNTER — Encounter (HOSPITAL_BASED_OUTPATIENT_CLINIC_OR_DEPARTMENT_OTHER): Payer: Managed Care, Other (non HMO) | Admitting: Anesthesiology

## 2013-12-28 ENCOUNTER — Encounter (HOSPITAL_BASED_OUTPATIENT_CLINIC_OR_DEPARTMENT_OTHER): Payer: Self-pay

## 2013-12-28 DIAGNOSIS — Z96649 Presence of unspecified artificial hip joint: Secondary | ICD-10-CM | POA: Insufficient documentation

## 2013-12-28 DIAGNOSIS — Z9012 Acquired absence of left breast and nipple: Secondary | ICD-10-CM

## 2013-12-28 DIAGNOSIS — Z853 Personal history of malignant neoplasm of breast: Secondary | ICD-10-CM | POA: Insufficient documentation

## 2013-12-28 DIAGNOSIS — Z421 Encounter for breast reconstruction following mastectomy: Secondary | ICD-10-CM | POA: Insufficient documentation

## 2013-12-28 DIAGNOSIS — Z87891 Personal history of nicotine dependence: Secondary | ICD-10-CM | POA: Insufficient documentation

## 2013-12-28 DIAGNOSIS — C50912 Malignant neoplasm of unspecified site of left female breast: Secondary | ICD-10-CM

## 2013-12-28 DIAGNOSIS — Z9010 Acquired absence of unspecified breast and nipple: Secondary | ICD-10-CM | POA: Insufficient documentation

## 2013-12-28 HISTORY — PX: BREAST RECONSTRUCTION WITH PLACEMENT OF TISSUE EXPANDER AND FLEX HD (ACELLULAR HYDRATED DERMIS): SHX6295

## 2013-12-28 HISTORY — DX: Unspecified osteoarthritis, unspecified site: M19.90

## 2013-12-28 LAB — POCT HEMOGLOBIN-HEMACUE: HEMOGLOBIN: 15.1 g/dL — AB (ref 12.0–15.0)

## 2013-12-28 SURGERY — BREAST RECONSTRUCTION WITH PLACEMENT OF TISSUE EXPANDER AND FLEX HD (ACELLULAR HYDRATED DERMIS)
Anesthesia: General | Site: Breast | Laterality: Left

## 2013-12-28 MED ORDER — CEFAZOLIN SODIUM-DEXTROSE 2-3 GM-% IV SOLR
2.0000 g | INTRAVENOUS | Status: AC
  Administered 2013-12-28: 2 g via INTRAVENOUS

## 2013-12-28 MED ORDER — BUPIVACAINE-EPINEPHRINE PF 0.25-1:200000 % IJ SOLN
INTRAMUSCULAR | Status: AC
  Filled 2013-12-28: qty 30

## 2013-12-28 MED ORDER — MIDAZOLAM HCL 2 MG/2ML IJ SOLN
INTRAMUSCULAR | Status: AC
  Filled 2013-12-28: qty 2

## 2013-12-28 MED ORDER — SCOPOLAMINE 1 MG/3DAYS TD PT72
1.0000 | MEDICATED_PATCH | TRANSDERMAL | Status: DC
  Administered 2013-12-28: 1.5 mg via TRANSDERMAL

## 2013-12-28 MED ORDER — SUCCINYLCHOLINE CHLORIDE 20 MG/ML IJ SOLN
INTRAMUSCULAR | Status: DC | PRN
  Administered 2013-12-28: 100 mg via INTRAVENOUS

## 2013-12-28 MED ORDER — BUPIVACAINE-EPINEPHRINE 0.25% -1:200000 IJ SOLN
INTRAMUSCULAR | Status: DC | PRN
  Administered 2013-12-28: 10 mL

## 2013-12-28 MED ORDER — ONDANSETRON HCL 4 MG/2ML IJ SOLN
INTRAMUSCULAR | Status: DC | PRN
  Administered 2013-12-28: 4 mg via INTRAVENOUS

## 2013-12-28 MED ORDER — CEFAZOLIN SODIUM 1-5 GM-% IV SOLN
INTRAVENOUS | Status: AC
  Filled 2013-12-28: qty 100

## 2013-12-28 MED ORDER — PROPOFOL 10 MG/ML IV BOLUS
INTRAVENOUS | Status: DC | PRN
  Administered 2013-12-28: 200 mg via INTRAVENOUS

## 2013-12-28 MED ORDER — MIDAZOLAM HCL 5 MG/5ML IJ SOLN
INTRAMUSCULAR | Status: DC | PRN
  Administered 2013-12-28: 2 mg via INTRAVENOUS

## 2013-12-28 MED ORDER — OXYCODONE HCL 5 MG PO TABS
5.0000 mg | ORAL_TABLET | Freq: Once | ORAL | Status: AC | PRN
  Administered 2013-12-28: 5 mg via ORAL
  Filled 2013-12-28: qty 1

## 2013-12-28 MED ORDER — SUCCINYLCHOLINE CHLORIDE 20 MG/ML IJ SOLN
INTRAMUSCULAR | Status: AC
  Filled 2013-12-28: qty 1

## 2013-12-28 MED ORDER — LIDOCAINE HCL (CARDIAC) 20 MG/ML IV SOLN
INTRAVENOUS | Status: DC | PRN
  Administered 2013-12-28: 75 mg via INTRAVENOUS

## 2013-12-28 MED ORDER — SCOPOLAMINE 1 MG/3DAYS TD PT72
MEDICATED_PATCH | TRANSDERMAL | Status: AC
  Filled 2013-12-28: qty 1

## 2013-12-28 MED ORDER — OXYCODONE HCL 5 MG/5ML PO SOLN
5.0000 mg | Freq: Once | ORAL | Status: AC | PRN
Start: 2013-12-28 — End: 2013-12-28

## 2013-12-28 MED ORDER — LACTATED RINGERS IV SOLN
INTRAVENOUS | Status: DC
  Administered 2013-12-28 (×2): via INTRAVENOUS

## 2013-12-28 MED ORDER — DEXAMETHASONE SODIUM PHOSPHATE 4 MG/ML IJ SOLN
INTRAMUSCULAR | Status: DC | PRN
  Administered 2013-12-28: 10 mg via INTRAVENOUS

## 2013-12-28 MED ORDER — SUFENTANIL CITRATE 50 MCG/ML IV SOLN
INTRAVENOUS | Status: DC | PRN
  Administered 2013-12-28: 20 ug via INTRAVENOUS

## 2013-12-28 MED ORDER — ONDANSETRON HCL 4 MG/2ML IJ SOLN: 4.0000 mg | Freq: Once | INTRAMUSCULAR | Status: DC | PRN

## 2013-12-28 MED ORDER — PROPOFOL 10 MG/ML IV EMUL
INTRAVENOUS | Status: AC
  Filled 2013-12-28: qty 50

## 2013-12-28 MED ORDER — HYDROMORPHONE HCL PF 1 MG/ML IJ SOLN: 0.2500 mg | INTRAMUSCULAR | Status: DC | PRN

## 2013-12-28 MED ORDER — SUFENTANIL CITRATE 50 MCG/ML IV SOLN
INTRAVENOUS | Status: AC
  Filled 2013-12-28: qty 1

## 2013-12-28 MED ORDER — EPHEDRINE SULFATE 50 MG/ML IJ SOLN
INTRAMUSCULAR | Status: DC | PRN
  Administered 2013-12-28: 10 mg via INTRAVENOUS

## 2013-12-28 MED ORDER — PHENYLEPHRINE HCL 10 MG/ML IJ SOLN
INTRAMUSCULAR | Status: DC | PRN
  Administered 2013-12-28 (×2): 40 ug via INTRAVENOUS

## 2013-12-28 MED ORDER — MIDAZOLAM HCL 2 MG/2ML IJ SOLN: 1.0000 mg | INTRAMUSCULAR | Status: DC | PRN

## 2013-12-28 MED ORDER — FENTANYL CITRATE 0.05 MG/ML IJ SOLN: 50.0000 ug | INTRAMUSCULAR | Status: DC | PRN

## 2013-12-28 SURGICAL SUPPLY — 65 items
BAG DECANTER FOR FLEXI CONT (MISCELLANEOUS) ×3 IMPLANT
BINDER BREAST LRG (GAUZE/BANDAGES/DRESSINGS) IMPLANT
BINDER BREAST MEDIUM (GAUZE/BANDAGES/DRESSINGS) ×3 IMPLANT
BINDER BREAST XLRG (GAUZE/BANDAGES/DRESSINGS) IMPLANT
BINDER BREAST XXLRG (GAUZE/BANDAGES/DRESSINGS) IMPLANT
BIOPATCH RED 1 DISK 7.0 (GAUZE/BANDAGES/DRESSINGS) ×2 IMPLANT
BIOPATCH RED 1IN DISK 7.0MM (GAUZE/BANDAGES/DRESSINGS) ×1
BLADE HEX COATED 2.75 (ELECTRODE) ×3 IMPLANT
BLADE SURG 15 STRL LF DISP TIS (BLADE) ×1 IMPLANT
BLADE SURG 15 STRL SS (BLADE) ×2
BNDG GAUZE ELAST 4 BULKY (GAUZE/BANDAGES/DRESSINGS) ×3 IMPLANT
CANISTER SUCT 1200ML W/VALVE (MISCELLANEOUS) ×3 IMPLANT
CHLORAPREP W/TINT 26ML (MISCELLANEOUS) ×3 IMPLANT
CORDS BIPOLAR (ELECTRODE) IMPLANT
COVER MAYO STAND STRL (DRAPES) ×3 IMPLANT
COVER TABLE BACK 60X90 (DRAPES) ×3 IMPLANT
DECANTER SPIKE VIAL GLASS SM (MISCELLANEOUS) IMPLANT
DERMABOND ADVANCED (GAUZE/BANDAGES/DRESSINGS) ×2
DERMABOND ADVANCED .7 DNX12 (GAUZE/BANDAGES/DRESSINGS) ×1 IMPLANT
DRAIN CHANNEL 19F RND (DRAIN) ×3 IMPLANT
DRAPE LAPAROSCOPIC ABDOMINAL (DRAPES) ×3 IMPLANT
DRSG TEGADERM 2-3/8X2-3/4 SM (GAUZE/BANDAGES/DRESSINGS) ×3 IMPLANT
ELECT BLADE 4.0 EZ CLEAN MEGAD (MISCELLANEOUS) ×3
ELECT REM PT RETURN 9FT ADLT (ELECTROSURGICAL) ×3
ELECTRODE BLDE 4.0 EZ CLN MEGD (MISCELLANEOUS) ×1 IMPLANT
ELECTRODE REM PT RTRN 9FT ADLT (ELECTROSURGICAL) ×1 IMPLANT
EVACUATOR SILICONE 100CC (DRAIN) ×3 IMPLANT
EXPANDER BREAST 275CC (Breast) ×3 IMPLANT
GLOVE BIO SURGEON STRL SZ 6.5 (GLOVE) ×6 IMPLANT
GLOVE BIO SURGEONS STRL SZ 6.5 (GLOVE) ×3
GLOVE SURG SS PI 7.0 STRL IVOR (GLOVE) ×3 IMPLANT
GOWN STRL REUS W/ TWL LRG LVL3 (GOWN DISPOSABLE) ×3 IMPLANT
GOWN STRL REUS W/TWL LRG LVL3 (GOWN DISPOSABLE) ×6
GRAFT FLEX HD 4X16 THICK (Tissue Mesh) ×3 IMPLANT
IV NS 1000ML (IV SOLUTION)
IV NS 1000ML BAXH (IV SOLUTION) IMPLANT
IV NS 500ML (IV SOLUTION) ×2
IV NS 500ML BAXH (IV SOLUTION) ×1 IMPLANT
KIT FILL SYSTEM UNIVERSAL (SET/KITS/TRAYS/PACK) ×3 IMPLANT
NEEDLE HYPO 25X1 1.5 SAFETY (NEEDLE) ×3 IMPLANT
PACK BASIN DAY SURGERY FS (CUSTOM PROCEDURE TRAY) ×3 IMPLANT
PAD ABD 8X10 STRL (GAUZE/BANDAGES/DRESSINGS) ×3 IMPLANT
PENCIL BUTTON HOLSTER BLD 10FT (ELECTRODE) ×3 IMPLANT
PIN SAFETY STERILE (MISCELLANEOUS) ×3 IMPLANT
SET ASEPTIC TRANSFER (MISCELLANEOUS) ×6 IMPLANT
SLEEVE SCD COMPRESS KNEE MED (MISCELLANEOUS) ×3 IMPLANT
SPONGE GAUZE 4X4 12PLY STER LF (GAUZE/BANDAGES/DRESSINGS) IMPLANT
SPONGE LAP 18X18 X RAY DECT (DISPOSABLE) ×6 IMPLANT
SUT MNCRL AB 4-0 PS2 18 (SUTURE) IMPLANT
SUT MON AB 5-0 PS2 18 (SUTURE) ×3 IMPLANT
SUT PDS 3-0 CT2 (SUTURE)
SUT PDS AB 2-0 CT2 27 (SUTURE) ×9 IMPLANT
SUT PDS II 3-0 CT2 27 ABS (SUTURE) IMPLANT
SUT SILK 3 0 PS 1 (SUTURE) ×3 IMPLANT
SUT VIC AB 3-0 SH 27 (SUTURE) ×2
SUT VIC AB 3-0 SH 27X BRD (SUTURE) ×1 IMPLANT
SUT VICRYL 4-0 PS2 18IN ABS (SUTURE) ×3 IMPLANT
SYR 50ML LL SCALE MARK (SYRINGE) IMPLANT
SYR BULB IRRIGATION 50ML (SYRINGE) ×3 IMPLANT
SYR CONTROL 10ML LL (SYRINGE) ×3 IMPLANT
TOWEL OR 17X24 6PK STRL BLUE (TOWEL DISPOSABLE) ×6 IMPLANT
TUBE CONNECTING 20'X1/4 (TUBING) ×1
TUBE CONNECTING 20X1/4 (TUBING) ×2 IMPLANT
UNDERPAD 30X30 INCONTINENT (UNDERPADS AND DIAPERS) ×6 IMPLANT
YANKAUER SUCT BULB TIP NO VENT (SUCTIONS) ×3 IMPLANT

## 2013-12-28 NOTE — Brief Op Note (Signed)
12/28/2013  8:30 AM  PATIENT:  [name]  58 y.o. female  PRE-OPERATIVE DIAGNOSIS:  HX OF BREAST CANCER   POST-OPERATIVE DIAGNOSIS:  HX OF BREAST CANCER   PROCEDURE:  Procedure(s): BREAST RECONSTRUCTION WITH PLACEMENT OF TISSUE EXPANDER AND FLEX HD TO LEFT BREAST  (ACELLULAR HYDRATED DERMIS) (Left)  SURGEON:  Surgeon(s) and Role:    * Claire Sanger, DO - Primary  PHYSICIAN ASSISTANT: Shawn Rayburn, PA  ASSISTANTS: none   ANESTHESIA:   general  EBL:  Total I/O In: 1000 [I.V.:1000] Out: -   BLOOD ADMINISTERED:none  DRAINS: 1 JP on left  LOCAL MEDICATIONS USED:  MARCAINE     SPECIMEN:  Source of Specimen:  Mastectomy scar from left  DISPOSITION OF SPECIMEN:  PATHOLOGY  COUNTS:  YES  TOURNIQUET:  * No tourniquets in log *  DICTATION: .Dragon Dictation  PLAN OF CARE: Discharge to home after PACU  PATIENT DISPOSITION:  PACU - hemodynamically stable.   Delay start of Pharmacological VTE agent (>24hrs) due to surgical blood loss or risk of bleeding: no

## 2013-12-28 NOTE — Anesthesia Procedure Notes (Signed)
Procedure Name: Intubation Date/Time: 12/28/2013 7:43 AM Performed by: Melynda Ripple D Pre-anesthesia Checklist: Patient identified, Emergency Drugs available, Suction available and Patient being monitored Patient Re-evaluated:Patient Re-evaluated prior to inductionOxygen Delivery Method: Circle System Utilized Preoxygenation: Pre-oxygenation with 100% oxygen Intubation Type: IV induction Ventilation: Mask ventilation without difficulty Laryngoscope Size: Miller and 2 Grade View: Grade I Tube type: Oral Number of attempts: 1 Airway Equipment and Method: stylet and oral airway Placement Confirmation: ETT inserted through vocal cords under direct vision,  positive ETCO2 and breath sounds checked- equal and bilateral Secured at: 22 cm Tube secured with: Tape Dental Injury: Teeth and Oropharynx as per pre-operative assessment

## 2013-12-28 NOTE — Discharge Instructions (Signed)
Drain Care Sink bath Continue binder or sports bra   Post Anesthesia Home Care Instructions  Activity: Get plenty of rest for the remainder of the day. A responsible adult should stay with you for 24 hours following the procedure.  For the next 24 hours, DO NOT: -Drive a car -Paediatric nurse -Drink alcoholic beverages -Take any medication unless instructed by your physician -Make any legal decisions or sign important papers.  Meals: Start with liquid foods such as gelatin or soup. Progress to regular foods as tolerated. Avoid greasy, spicy, heavy foods. If nausea and/or vomiting occur, drink only clear liquids until the nausea and/or vomiting subsides. Call your physician if vomiting continues.  Special Instructions/Symptoms: Your throat may feel dry or sore from the anesthesia or the breathing tube placed in your throat during surgery. If this causes discomfort, gargle with warm salt water. The discomfort should disappear within 24 hours.

## 2013-12-28 NOTE — Transfer of Care (Signed)
Immediate Anesthesia Transfer of Care Note  Patient: [name]  Procedure(s) Performed: Procedure(s): BREAST RECONSTRUCTION WITH PLACEMENT OF TISSUE EXPANDER AND FLEX HD TO LEFT BREAST  (ACELLULAR HYDRATED DERMIS) (Left)  Patient Location: PACU  Anesthesia Type:General  Level of Consciousness: awake, alert  and oriented  Airway & Oxygen Therapy: Patient Spontanous Breathing and Patient connected to face mask oxygen  Post-op Assessment: Report given to PACU RN and Post -op Vital signs reviewed and stable  Post vital signs: Reviewed and stable  Complications: No apparent anesthesia complications

## 2013-12-28 NOTE — Op Note (Signed)
Op report    DATE OF OPERATION: 12/28/2013  LOCATION: [place]  SURGICAL DIVISION: Plastic Surgery  PREOPERATIVE DIAGNOSES:  1. History of Left breast cancer.  2. Acquired absence left breast.   POSTOPERATIVE DIAGNOSES:  1. History of breast cancer.  2. Acquired absence of left breast.   PROCEDURE:  1. Left breast expander placement and FlexHD placement (4 x 16)  SURGEON: Prithvi Kooi Sanger, DO  ASSISTANT: Shawn Rayburn, PA  ANESTHESIA:  General.   COMPLICATIONS: None.   IMPLANTS: Left - Mentor Medium Height 275 cc expander with 100 cc saline placed  INDICATIONS FOR PROCEDURE:  The patient, [name], is a 58 y.o. female born on 05/21/1956, is here for treatment after left mastectomy. She now presents for placement of her left breast expander.   CONSENT:  Informed consent was obtained directly from the patient. Risks, benefits and alternatives were fully discussed. Specific risks including but not limited to bleeding, infection, hematoma, seroma, scarring, pain, implant infection, implant extrusion, capsular contracture, asymmetry, wound healing problems, and need for further surgery were all discussed. The patient did have an ample opportunity to have her questions answered to her satisfaction.   DESCRIPTION OF PROCEDURE:  The patient was taken to the operating room. SCDs were placed and IV antibiotics were given. The patient's chest was prepped and draped in a sterile fashion. A time out was performed and the expander to be used was identified.  One percent Lidocaine with epinephrine was used to infiltrate the area.   The old mastectomy scar was opened and superior mastectomy and inferior mastectomy flaps were re-raised over the pectoralis major muscle. The pectoralis was raised and a pocket created for placement of the expander.  The air was evacuated from the expander and the expander placed under the pectoralis muscle. Injectable saline was placed in the  expander at 100cc.  The expander was secured to the chest wall at the inferior and lateral aspect.  The Flex HD was then prepared according to the manufacture guidelines and sutured with 2-0 PDS to the inferior lateral edge of the pectoralis major muscle and the inframammary fold at the juncture of the rectus muscle.  The drain was placed and secured with a 3-0 Silk.  The skin was closed with 3-0 Vicryl followed by 4-0 Monocryl and the skin closed with 5-0 Monocryl.  Dermabond, ABD and breast binder were applied.  The patient tolerated the procedure well.  There were no complications. The patient was awakened from anesthesia and taken to the recovery room in satisfactory condition.

## 2013-12-28 NOTE — Anesthesia Preprocedure Evaluation (Signed)
Anesthesia Evaluation  Patient identified by MRN, date of birth, ID band Patient awake    Reviewed: Allergy & Precautions, H&P , NPO status , Patient's Chart, lab work & pertinent test results  History of Anesthesia Complications (+) PONV  Airway Mallampati: I TM Distance: >3 FB Neck ROM: Full    Dental  (+) Teeth Intact, Partial Lower, Partial Upper and Dental Advisory Given   Pulmonary former smoker,  breath sounds clear to auscultation        Cardiovascular Rhythm:Regular Rate:Normal     Neuro/Psych    GI/Hepatic   Endo/Other    Renal/GU      Musculoskeletal   Abdominal   Peds  Hematology   Anesthesia Other Findings   Reproductive/Obstetrics                           Anesthesia Physical Anesthesia Plan  ASA: II  Anesthesia Plan: General   Post-op Pain Management:    Induction: Intravenous  Airway Management Planned: Oral ETT  Additional Equipment:   Intra-op Plan:   Post-operative Plan: Extubation in OR  Informed Consent: I have reviewed the patients History and Physical, chart, labs and discussed the procedure including the risks, benefits and alternatives for the proposed anesthesia with the patient or authorized representative who has indicated his/her understanding and acceptance.   Dental advisory given  Plan Discussed with: Anesthesiologist, CRNA and Surgeon  Anesthesia Plan Comments:         Anesthesia Quick Evaluation

## 2013-12-28 NOTE — H&P (Signed)
[name]  12/12/2013 8:30 AM   Office Visit  MRN:  [id number]  Department: [facility]  Dept Phone: [phone]  Description: Female DOB: March 22, 1956  Provider: Theodoro Kos, DO    Diagnoses    Acquired absence of breast and absent nipple, left    -  Primary   V45.71      Reason for Visit -  Breast Reconstruction     Vitals - Last Recorded    120/65  82  1.676 m (5\' 6" )  58.968 kg (130 lb)  20.99 kg/m2       Subjective:    Patient ID: [name] is a 58 y.o. female.  HPI The patient is a 58 yrs old wf here for a history and physical for left breast reconstruction with expander and FlexHd placement. She was diagnosed with left breast cancer and underwent a mastectomy with chemotherapy in 2001 in [place]. She did not get radiation. She wears a 36B, is 5 feet 6 inches tall and weighs 130 pounds. She does not use tobacco. She decided on delayed reconstruction because she was not sure she wanted to do anything at the original procedure. Since then she finds it very difficult to wear clothes and a bra without the prosthetic shifting and the bra twisting. She would like to be a little bigger than now. She had both hips replaced this year and missed 3 months of work.   The following portions of the patient's history were reviewed and updated as appropriate: allergies, current medications, past family history, past medical history, past social history, past surgical history and problem list.   Review of Systems  Constitutional: Negative.   HENT: Negative.   Eyes: Negative.   Respiratory: Negative.   Cardiovascular: Negative.   Gastrointestinal: Negative.   Endocrine: Negative.   Genitourinary: Negative.   Neurological: Negative.   Hematological: Negative.   Psychiatric/Behavioral: Negative.         Objective:     Physical Exam  Constitutional: She is oriented to person, place, and time. She appears well-developed and well-nourished.  HENT:   Head: Normocephalic and  atraumatic.  Eyes: EOM are normal. Pupils are equal, round, and reactive to light.  Neck: Normal range of motion.  Cardiovascular: Normal rate.   Pulmonary/Chest: Effort normal.  Abdominal: Soft.  Musculoskeletal: Normal range of motion.  Neurological: She is alert and oriented to person, place, and time.  Skin: Skin is warm.  Psychiatric: She has a normal mood and affect. Her behavior is normal. Judgment and thought content normal.      Assessment:    1.  Acquired absence of breast and absent nipple, left        Plan:      The consent was obtained with risks and complications reviewed which included bleeding, pain, scar, infection and the risk of anesthesia.  The patients questions were answered to the patients expressed satisfaction.  We will plan for a left breast reconstruction with expander and Flex HD placement.    Medications Ordered This Encounter    cephalexin (KEFLEX) 500 MG capsule  Take 1 capsule (500 mg total) by mouth 4 times daily.   diazepam (VALIUM) 2 MG tablet  Take 1 tablet (2 mg total) by mouth every 6 (six) hours as needed for up to 10 days for Anxiety.    ondansetron (ZOFRAN, AS HYDROCHLORIDE,) 4 MG  Take 1 tablet (4 mg total) by mouth every 8 (eight) hours as needed for up to 7 days for  Nausea / Vomiting.     HYDROcodone-acetaminophen (NORCO) 5-325 mg per tablet Take 1 tablet by mouth every 6 (six) hours as needed for up to 10 days for Pain.     Discontinued Medications    gabapentin (NEURONTIN) 300 MG capsule Completed Therapy    meloxicam (MOBIC) 15 MG tablet Completed Therapy    omeprazole (PRILOSEC) 20 MG capsule Completed Therapy

## 2013-12-28 NOTE — Anesthesia Postprocedure Evaluation (Signed)
  Anesthesia Post-op Note  Patient: [name]  Procedure(s) Performed: Procedure(s): BREAST RECONSTRUCTION WITH PLACEMENT OF TISSUE EXPANDER AND FLEX HD TO LEFT BREAST  (ACELLULAR HYDRATED DERMIS) (Left)  Patient Location: PACU  Anesthesia Type:General  Level of Consciousness: awake, alert  and oriented  Airway and Oxygen Therapy: Patient Spontanous Breathing and Patient connected to face mask oxygen  Post-op Pain: mild  Post-op Assessment: Post-op Vital signs reviewed  Post-op Vital Signs: Reviewed  Complications: No apparent anesthesia complications

## 2014-01-01 ENCOUNTER — Encounter (HOSPITAL_BASED_OUTPATIENT_CLINIC_OR_DEPARTMENT_OTHER): Payer: Self-pay | Admitting: Plastic Surgery

## 2014-03-09 ENCOUNTER — Other Ambulatory Visit: Payer: Self-pay | Admitting: Plastic Surgery

## 2014-03-09 ENCOUNTER — Encounter (HOSPITAL_BASED_OUTPATIENT_CLINIC_OR_DEPARTMENT_OTHER): Payer: Self-pay | Admitting: *Deleted

## 2014-03-09 DIAGNOSIS — N651 Disproportion of reconstructed breast: Secondary | ICD-10-CM

## 2014-03-09 DIAGNOSIS — Z9012 Acquired absence of left breast and nipple: Secondary | ICD-10-CM

## 2014-03-09 NOTE — Progress Notes (Signed)
No labs needed-had surgery here 1/15-did well

## 2014-03-09 NOTE — H&P (Signed)
[name] is an 58 y.o. female.   Chief Complaint: breast cancer HPI: The patient is a 58 yrs old wf here for history and physical for left breast reconstruction with expander and FlexHd placement on 12/28/2013.  She is looking at 03/15/14 for exchange surgery. We are planning to place a right breast silicone implant for symmetry at the time of her exchange surgery.   History: She was diagnosed with left breast cancer and underwent a mastectomy with chemotherapy in 2001 in [place]. She did not get radiation. She wears a 36B, is 5 feet 6 inches tall and weighs 130 pounds. She does not use tobacco. She decided on delayed reconstruction because she was not sure she wanted to do anything at the original procedure. Since then she finds it very difficult to wear clothes and a bra without the prosthetic shifting and the bra twisting. She would like to be a little bigger than now. She had both hips replaced this year and missed 3 months of work.  The left expander was filled with 40 cc of sterile saline for a total of 430/275 cc.  Past Medical History  Diagnosis Date  . PONV (postoperative nausea and vomiting)   . Bronchitis 05-12-13    occ. bouts with bronchitis  . Recurrent sinus infections     last  2 months ago  . History of frequent urinary tract infections     last tx. 1 month ago  . Benign meningioma 05-12-13    dx. '98- small, no problems  . Cancer 02/2010    left breast cancer  . History of cancer chemotherapy   . Arthritis     osteo  . Wears partial dentures     partials top and bottom    Past Surgical History  Procedure Laterality Date  . Tubal ligation    . Cholecystectomy    . Port-acath  05-12-13    insertion and removal  . Total hip arthroplasty Left 05/26/2013    Procedure: Left TOTAL HIP ARTHROPLASTY ANTERIOR APPROACH;  Surgeon: Mcarthur Rossetti, MD;  Location: [facility];  Service: Orthopedics;  Laterality: Left;  . Breast surgery Left 2011    '11- left breast mastectomy  .  Total hip arthroplasty Right 07/07/2013    Procedure: RIGHT TOTAL HIP ARTHROPLASTY ANTERIOR APPROACH;  Surgeon: Mcarthur Rossetti, MD;  Location: [facility];  Service: Orthopedics;  Laterality: Right;  . Breast reconstruction with placement of tissue expander and flex hd (acellular hydrated dermis) Left 12/28/2013    Procedure: BREAST RECONSTRUCTION WITH PLACEMENT OF TISSUE EXPANDER AND FLEX HD TO LEFT BREAST  (ACELLULAR HYDRATED DERMIS);  Surgeon: Theodoro Kos, DO;  Location: [facility];  Service: Plastics;  Laterality: Left;    Family History  Problem Relation Age of Onset  . Colon cancer Mother    Social History:  reports that she quit smoking about 3 years ago. Her smoking use included Cigarettes. She smoked 0.00 packs per day for 20 years. She has never used smokeless tobacco. She reports that she drinks alcohol. She reports that she does not use illicit drugs.  Allergies:  Allergies  Allergen Reactions  . Other     "narcotics" causes nausea and vomiting     (Not in a hospital admission)  No results found for this or any previous visit (from the past 48 hour(s)). No results found.  Review of Systems  Constitutional: Negative.   HENT: Negative.   Eyes: Negative.   Respiratory: Negative.   Cardiovascular:  Negative.   Gastrointestinal: Negative.   Genitourinary: Negative.   Musculoskeletal: Negative.   Skin: Negative.   Neurological: Negative.   Psychiatric/Behavioral: Negative.     There were no vitals taken for this visit. Physical Exam  Constitutional: She appears well-developed and well-nourished.  HENT:  Head: Normocephalic and atraumatic.  Eyes: Conjunctivae and EOM are normal. Pupils are equal, round, and reactive to light.  Cardiovascular: Normal rate.   Respiratory: Effort normal.  GI: Soft.  Musculoskeletal: Normal range of motion.  Neurological: She is alert.  Skin: Skin is warm.  Psychiatric: She has a normal mood and affect. Her behavior  is normal. Judgment and thought content normal.     Assessment/Plan Left breast expander removal and placement of implant and right breast mastopexy and implant placement.  SANGER,Kasidi Shanker 03/09/2014, 1:12 PM

## 2014-03-15 ENCOUNTER — Ambulatory Visit (HOSPITAL_BASED_OUTPATIENT_CLINIC_OR_DEPARTMENT_OTHER)
Admission: RE | Admit: 2014-03-15 | Discharge: 2014-03-15 | Disposition: A | Discharge status: Home / Self Care | Payer: Managed Care, Other (non HMO) | Source: Ambulatory Visit | Attending: Plastic Surgery | Admitting: Plastic Surgery

## 2014-03-15 ENCOUNTER — Ambulatory Visit (HOSPITAL_BASED_OUTPATIENT_CLINIC_OR_DEPARTMENT_OTHER): Payer: Managed Care, Other (non HMO) | Admitting: Anesthesiology

## 2014-03-15 ENCOUNTER — Encounter (HOSPITAL_BASED_OUTPATIENT_CLINIC_OR_DEPARTMENT_OTHER)
Admission: RE | Disposition: A | Discharge status: Home / Self Care | Payer: Self-pay | Source: Ambulatory Visit | Attending: Plastic Surgery

## 2014-03-15 ENCOUNTER — Encounter (HOSPITAL_BASED_OUTPATIENT_CLINIC_OR_DEPARTMENT_OTHER): Payer: Self-pay

## 2014-03-15 ENCOUNTER — Encounter (HOSPITAL_BASED_OUTPATIENT_CLINIC_OR_DEPARTMENT_OTHER): Payer: Managed Care, Other (non HMO) | Admitting: Anesthesiology

## 2014-03-15 DIAGNOSIS — L989 Disorder of the skin and subcutaneous tissue, unspecified: Secondary | ICD-10-CM | POA: Insufficient documentation

## 2014-03-15 DIAGNOSIS — Z421 Encounter for breast reconstruction following mastectomy: Secondary | ICD-10-CM | POA: Insufficient documentation

## 2014-03-15 DIAGNOSIS — Z9010 Acquired absence of unspecified breast and nipple: Secondary | ICD-10-CM | POA: Insufficient documentation

## 2014-03-15 DIAGNOSIS — Z96649 Presence of unspecified artificial hip joint: Secondary | ICD-10-CM | POA: Insufficient documentation

## 2014-03-15 DIAGNOSIS — Z9012 Acquired absence of left breast and nipple: Secondary | ICD-10-CM

## 2014-03-15 DIAGNOSIS — Z9221 Personal history of antineoplastic chemotherapy: Secondary | ICD-10-CM | POA: Insufficient documentation

## 2014-03-15 DIAGNOSIS — Z853 Personal history of malignant neoplasm of breast: Secondary | ICD-10-CM | POA: Insufficient documentation

## 2014-03-15 DIAGNOSIS — N651 Disproportion of reconstructed breast: Secondary | ICD-10-CM

## 2014-03-15 DIAGNOSIS — Z87891 Personal history of nicotine dependence: Secondary | ICD-10-CM | POA: Insufficient documentation

## 2014-03-15 DIAGNOSIS — M1990 Unspecified osteoarthritis, unspecified site: Secondary | ICD-10-CM | POA: Insufficient documentation

## 2014-03-15 HISTORY — PX: PUNCH BIOPSY OF SKIN: SHX6390

## 2014-03-15 HISTORY — DX: Presence of dental prosthetic device (complete) (partial): Z97.2

## 2014-03-15 HISTORY — PX: MASTOPEXY: SHX5358

## 2014-03-15 HISTORY — PX: REMOVAL OF BILATERAL TISSUE EXPANDERS WITH PLACEMENT OF BILATERAL BREAST IMPLANTS: SHX6431

## 2014-03-15 LAB — POCT HEMOGLOBIN-HEMACUE: Hemoglobin: 15.2 g/dL — ABNORMAL HIGH (ref 12.0–15.0)

## 2014-03-15 SURGERY — REMOVAL, TISSUE EXPANDER, BREAST, BILATERAL, WITH BILATERAL IMPLANT IMPLANT INSERTION
Anesthesia: General | Site: Chest | Laterality: Right

## 2014-03-15 MED ORDER — LIDOCAINE-EPINEPHRINE 1 %-1:100000 IJ SOLN
INTRAMUSCULAR | Status: AC
  Filled 2014-03-15: qty 1

## 2014-03-15 MED ORDER — SODIUM CHLORIDE 0.9 % IR SOLN
Status: DC | PRN
  Administered 2014-03-15: 08:00:00

## 2014-03-15 MED ORDER — MIDAZOLAM HCL 2 MG/2ML IJ SOLN
INTRAMUSCULAR | Status: AC
  Filled 2014-03-15: qty 2

## 2014-03-15 MED ORDER — LIDOCAINE HCL 1 % IJ SOLN
INTRAVENOUS | Status: DC | PRN
  Administered 2014-03-15: 09:00:00

## 2014-03-15 MED ORDER — MIDAZOLAM HCL 2 MG/2ML IJ SOLN: 1.0000 mg | INTRAMUSCULAR | Status: DC | PRN

## 2014-03-15 MED ORDER — LIDOCAINE-EPINEPHRINE 1 %-1:100000 IJ SOLN
INTRAMUSCULAR | Status: DC | PRN
  Administered 2014-03-15: 17 mL

## 2014-03-15 MED ORDER — OXYCODONE HCL 5 MG PO TABS
ORAL_TABLET | ORAL | Status: AC
  Filled 2014-03-15: qty 1

## 2014-03-15 MED ORDER — EPHEDRINE SULFATE 50 MG/ML IJ SOLN
INTRAMUSCULAR | Status: DC | PRN
  Administered 2014-03-15 (×2): 10 mg via INTRAVENOUS

## 2014-03-15 MED ORDER — PROPOFOL 10 MG/ML IV BOLUS
INTRAVENOUS | Status: DC | PRN
  Administered 2014-03-15: 200 mg via INTRAVENOUS

## 2014-03-15 MED ORDER — ONDANSETRON HCL 4 MG/2ML IJ SOLN
INTRAMUSCULAR | Status: DC | PRN
  Administered 2014-03-15: 4 mg via INTRAVENOUS

## 2014-03-15 MED ORDER — PROMETHAZINE HCL 25 MG/ML IJ SOLN
12.5000 mg | Freq: Four times a day (QID) | INTRAMUSCULAR | Status: DC | PRN
  Administered 2014-03-15: 12.5 mg via INTRAVENOUS

## 2014-03-15 MED ORDER — HYDROMORPHONE HCL PF 1 MG/ML IJ SOLN
0.2500 mg | INTRAMUSCULAR | Status: DC | PRN
  Administered 2014-03-15 (×2): 0.5 mg via INTRAVENOUS

## 2014-03-15 MED ORDER — SUCCINYLCHOLINE CHLORIDE 20 MG/ML IJ SOLN
INTRAMUSCULAR | Status: DC | PRN
Start: 2014-03-15 — End: 2014-03-15
  Administered 2014-03-15: 100 mg via INTRAVENOUS

## 2014-03-15 MED ORDER — SCOPOLAMINE 1 MG/3DAYS TD PT72
MEDICATED_PATCH | TRANSDERMAL | Status: AC
  Filled 2014-03-15: qty 1

## 2014-03-15 MED ORDER — ONDANSETRON HCL 4 MG/2ML IJ SOLN: 4.0000 mg | Freq: Once | INTRAMUSCULAR | Status: DC | PRN

## 2014-03-15 MED ORDER — HYDROMORPHONE HCL PF 1 MG/ML IJ SOLN
INTRAMUSCULAR | Status: AC
  Filled 2014-03-15: qty 1

## 2014-03-15 MED ORDER — LACTATED RINGERS IV SOLN
INTRAVENOUS | Status: DC
  Administered 2014-03-15 (×2): via INTRAVENOUS

## 2014-03-15 MED ORDER — PROMETHAZINE HCL 25 MG/ML IJ SOLN
INTRAMUSCULAR | Status: AC
  Filled 2014-03-15: qty 1

## 2014-03-15 MED ORDER — CEFAZOLIN SODIUM-DEXTROSE 2-3 GM-% IV SOLR
2.0000 g | INTRAVENOUS | Status: AC
  Administered 2014-03-15: 2 g via INTRAVENOUS

## 2014-03-15 MED ORDER — LACTATED RINGERS IV SOLN
INTRAVENOUS | Status: DC
  Administered 2014-03-15: 10 mL/h via INTRAVENOUS

## 2014-03-15 MED ORDER — MIDAZOLAM HCL 5 MG/5ML IJ SOLN
INTRAMUSCULAR | Status: DC | PRN
  Administered 2014-03-15: 2 mg via INTRAVENOUS

## 2014-03-15 MED ORDER — CEFAZOLIN SODIUM-DEXTROSE 2-3 GM-% IV SOLR
INTRAVENOUS | Status: AC
  Filled 2014-03-15: qty 50

## 2014-03-15 MED ORDER — BUPIVACAINE-EPINEPHRINE PF 0.25-1:200000 % IJ SOLN
INTRAMUSCULAR | Status: AC
  Filled 2014-03-15: qty 30

## 2014-03-15 MED ORDER — FENTANYL CITRATE 0.05 MG/ML IJ SOLN
INTRAMUSCULAR | Status: DC | PRN
  Administered 2014-03-15: 100 ug via INTRAVENOUS

## 2014-03-15 MED ORDER — OXYCODONE HCL 5 MG/5ML PO SOLN: 5.0000 mg | Freq: Once | ORAL | Status: AC | PRN

## 2014-03-15 MED ORDER — SODIUM CHLORIDE 0.9 % IV SOLN: 12.5000 mg | Freq: Once | INTRAVENOUS | Status: AC

## 2014-03-15 MED ORDER — LIDOCAINE HCL (CARDIAC) 20 MG/ML IV SOLN
INTRAVENOUS | Status: DC | PRN
  Administered 2014-03-15: 75 mg via INTRAVENOUS

## 2014-03-15 MED ORDER — SCOPOLAMINE 1 MG/3DAYS TD PT72
1.0000 | MEDICATED_PATCH | TRANSDERMAL | Status: DC
  Administered 2014-03-15: 1 via TRANSDERMAL

## 2014-03-15 MED ORDER — FENTANYL CITRATE 0.05 MG/ML IJ SOLN
INTRAMUSCULAR | Status: AC
  Filled 2014-03-15: qty 6

## 2014-03-15 MED ORDER — EPINEPHRINE HCL 1 MG/ML IJ SOLN
INTRAMUSCULAR | Status: AC
  Filled 2014-03-15: qty 1

## 2014-03-15 MED ORDER — LIDOCAINE HCL (PF) 1 % IJ SOLN
INTRAMUSCULAR | Status: AC
  Filled 2014-03-15: qty 30

## 2014-03-15 MED ORDER — OXYCODONE HCL 5 MG PO TABS
5.0000 mg | ORAL_TABLET | Freq: Once | ORAL | Status: AC | PRN
  Administered 2014-03-15: 5 mg via ORAL

## 2014-03-15 MED ORDER — FENTANYL CITRATE 0.05 MG/ML IJ SOLN: 50.0000 ug | INTRAMUSCULAR | Status: DC | PRN

## 2014-03-15 MED ORDER — DEXAMETHASONE SODIUM PHOSPHATE 4 MG/ML IJ SOLN
INTRAMUSCULAR | Status: DC | PRN
  Administered 2014-03-15: 10 mg via INTRAVENOUS

## 2014-03-15 SURGICAL SUPPLY — 95 items
BAG DECANTER FOR FLEXI CONT (MISCELLANEOUS) ×12 IMPLANT
BINDER BREAST LRG (GAUZE/BANDAGES/DRESSINGS) IMPLANT
BINDER BREAST MEDIUM (GAUZE/BANDAGES/DRESSINGS) ×6 IMPLANT
BINDER BREAST XLRG (GAUZE/BANDAGES/DRESSINGS) IMPLANT
BINDER BREAST XXLRG (GAUZE/BANDAGES/DRESSINGS) IMPLANT
BIOPATCH RED 1 DISK 7.0 (GAUZE/BANDAGES/DRESSINGS) IMPLANT
BIOPATCH RED 1IN DISK 7.0MM (GAUZE/BANDAGES/DRESSINGS)
BLADE HEX COATED 2.75 (ELECTRODE) ×6 IMPLANT
BLADE SURG 10 STRL SS (BLADE) ×6 IMPLANT
BLADE SURG 15 STRL LF DISP TIS (BLADE) ×8 IMPLANT
BLADE SURG 15 STRL SS (BLADE) ×4
BNDG GAUZE ELAST 4 BULKY (GAUZE/BANDAGES/DRESSINGS) ×12 IMPLANT
CANISTER LIPO FAT HARVEST (MISCELLANEOUS) IMPLANT
CANISTER SUCT 1200ML W/VALVE (MISCELLANEOUS) ×6 IMPLANT
CANNULA ASPIRATION (CANNULA) ×6 IMPLANT
CHLORAPREP W/TINT 26ML (MISCELLANEOUS) ×6 IMPLANT
CLOSURE WOUND 1/2 X4 (GAUZE/BANDAGES/DRESSINGS) ×2
CORDS BIPOLAR (ELECTRODE) IMPLANT
COVER MAYO STAND STRL (DRAPES) ×6 IMPLANT
COVER TABLE BACK 60X90 (DRAPES) ×6 IMPLANT
DECANTER SPIKE VIAL GLASS SM (MISCELLANEOUS) IMPLANT
DERMABOND ADVANCED (GAUZE/BANDAGES/DRESSINGS) ×4
DERMABOND ADVANCED .7 DNX12 (GAUZE/BANDAGES/DRESSINGS) ×8 IMPLANT
DRAIN CHANNEL 19F RND (DRAIN) IMPLANT
DRAPE LAPAROSCOPIC ABDOMINAL (DRAPES) ×6 IMPLANT
DRSG PAD ABDOMINAL 8X10 ST (GAUZE/BANDAGES/DRESSINGS) ×12 IMPLANT
DRSG TEGADERM 2-3/8X2-3/4 SM (GAUZE/BANDAGES/DRESSINGS) IMPLANT
ELECT BLADE 4.0 EZ CLEAN MEGAD (MISCELLANEOUS) ×6
ELECT REM PT RETURN 9FT ADLT (ELECTROSURGICAL) ×6
ELECTRODE BLDE 4.0 EZ CLN MEGD (MISCELLANEOUS) ×4 IMPLANT
ELECTRODE REM PT RTRN 9FT ADLT (ELECTROSURGICAL) ×4 IMPLANT
EVACUATOR SILICONE 100CC (DRAIN) IMPLANT
FILTER LIPOSUCTION (MISCELLANEOUS) ×6 IMPLANT
GLOVE BIO SURGEON STRL SZ 6.5 (GLOVE) ×20 IMPLANT
GLOVE BIO SURGEONS STRL SZ 6.5 (GLOVE) ×4
GLOVE BIOGEL PI IND STRL 6.5 (GLOVE) ×4 IMPLANT
GLOVE BIOGEL PI INDICATOR 6.5 (GLOVE) ×2
GLOVE ECLIPSE 6.5 STRL STRAW (GLOVE) ×6 IMPLANT
GLOVE SURG SS PI 7.0 STRL IVOR (GLOVE) ×6 IMPLANT
GOWN STRL REUS W/ TWL LRG LVL3 (GOWN DISPOSABLE) ×16 IMPLANT
GOWN STRL REUS W/TWL LRG LVL3 (GOWN DISPOSABLE) ×8
IMPL BREAST GEL HP 400CC (Breast) ×4 IMPLANT
IMPL BREAST GEL MP 125CC (Breast) ×4 IMPLANT
IMPLANT BREAST GEL HP 400CC (Breast) ×6 IMPLANT
IMPLANT BREAST GEL MP 125CC (Breast) ×6 IMPLANT
IV NS 1000ML (IV SOLUTION)
IV NS 1000ML BAXH (IV SOLUTION) IMPLANT
IV NS 500ML (IV SOLUTION) ×2
IV NS 500ML BAXH (IV SOLUTION) ×4 IMPLANT
KIT FILL SYSTEM UNIVERSAL (SET/KITS/TRAYS/PACK) ×6 IMPLANT
LINER CANISTER 1000CC FLEX (MISCELLANEOUS) ×6 IMPLANT
NDL SAFETY ECLIPSE 18X1.5 (NEEDLE) ×4 IMPLANT
NEEDLE HYPO 18GX1.5 SHARP (NEEDLE) ×2
NEEDLE HYPO 25X1 1.5 SAFETY (NEEDLE) ×6 IMPLANT
NEEDLE SPNL 18GX3.5 QUINCKE PK (NEEDLE) ×6 IMPLANT
NS IRRIG 1000ML POUR BTL (IV SOLUTION) IMPLANT
PACK BASIN DAY SURGERY FS (CUSTOM PROCEDURE TRAY) ×6 IMPLANT
PAD ALCOHOL SWAB (MISCELLANEOUS) ×6 IMPLANT
PENCIL BUTTON HOLSTER BLD 10FT (ELECTRODE) ×6 IMPLANT
PIN SAFETY STERILE (MISCELLANEOUS) IMPLANT
PUNCH BIOPSY DERMAL 3MM (MISCELLANEOUS) ×6 IMPLANT
SIZER BREAST REUSE 125CC (SIZER) ×2
SIZER BREAST REUSE 400CC (SIZER) ×2
SIZER BRST REUSE 400CC (SIZER) ×4 IMPLANT
SIZER BRST REUSE P3.5XHI 125CC (SIZER) ×4 IMPLANT
SIZER GENERIC MENTOR (SIZER) ×12 IMPLANT
SLEEVE SCD COMPRESS KNEE MED (MISCELLANEOUS) ×6 IMPLANT
SPONGE GAUZE 4X4 12PLY (GAUZE/BANDAGES/DRESSINGS) IMPLANT
SPONGE GAUZE 4X4 12PLY STER LF (GAUZE/BANDAGES/DRESSINGS) IMPLANT
SPONGE LAP 18X18 X RAY DECT (DISPOSABLE) ×12 IMPLANT
STRIP CLOSURE SKIN 1/2X4 (GAUZE/BANDAGES/DRESSINGS) ×10 IMPLANT
SUT MNCRL 6-0 UNDY P1 1X18 (SUTURE) ×4 IMPLANT
SUT MNCRL AB 4-0 PS2 18 (SUTURE) ×18 IMPLANT
SUT MON AB 3-0 SH 27 (SUTURE)
SUT MON AB 3-0 SH27 (SUTURE) IMPLANT
SUT MON AB 5-0 PS2 18 (SUTURE) ×12 IMPLANT
SUT MONOCRYL 6-0 P1 1X18 (SUTURE) ×2
SUT PDS 3-0 CT2 (SUTURE) ×12
SUT PDS AB 2-0 CT2 27 (SUTURE) IMPLANT
SUT PDS II 3-0 CT2 27 ABS (SUTURE) ×8 IMPLANT
SUT SILK 3 0 PS 1 (SUTURE) IMPLANT
SUT VIC AB 3-0 SH 27 (SUTURE) ×8
SUT VIC AB 3-0 SH 27X BRD (SUTURE) ×16 IMPLANT
SUT VICRYL 4-0 PS2 18IN ABS (SUTURE) ×12 IMPLANT
SYR 20CC LL (SYRINGE) IMPLANT
SYR 50ML LL SCALE MARK (SYRINGE) ×6 IMPLANT
SYR BULB IRRIGATION 50ML (SYRINGE) ×6 IMPLANT
SYR CONTROL 10ML LL (SYRINGE) ×6 IMPLANT
SYR TB 1ML LL NO SAFETY (SYRINGE) ×6 IMPLANT
TOWEL OR 17X24 6PK STRL BLUE (TOWEL DISPOSABLE) ×12 IMPLANT
TUBE CONNECTING 20'X1/4 (TUBING) ×1
TUBE CONNECTING 20X1/4 (TUBING) ×5 IMPLANT
TUBING SET GRADUATE ASPIR 12FT (MISCELLANEOUS) ×6 IMPLANT
UNDERPAD 30X30 INCONTINENT (UNDERPADS AND DIAPERS) ×12 IMPLANT
YANKAUER SUCT BULB TIP NO VENT (SUCTIONS) ×6 IMPLANT

## 2014-03-15 NOTE — Discharge Instructions (Addendum)
May shower starting on Saturday.   Post Anesthesia Home Care Instructions  Activity: Get plenty of rest for the remainder of the day. A responsible adult should stay with you for 24 hours following the procedure.  For the next 24 hours, DO NOT: -Drive a car -Paediatric nurse -Drink alcoholic beverages -Take any medication unless instructed by your physician -Make any legal decisions or sign important papers.  Meals: Start with liquid foods such as gelatin or soup. Progress to regular foods as tolerated. Avoid greasy, spicy, heavy foods. If nausea and/or vomiting occur, drink only clear liquids until the nausea and/or vomiting subsides. Call your physician if vomiting continues.  Special Instructions/Symptoms: Your throat may feel dry or sore from the anesthesia or the breathing tube placed in your throat during surgery. If this causes discomfort, gargle with warm salt water. The discomfort should disappear within 24 hours.

## 2014-03-15 NOTE — H&P (View-Only) (Signed)
[name] is an 58 y.o. female.   Chief Complaint: breast cancer HPI: The patient is a 58 yrs old wf here for history and physical for left breast reconstruction with expander and FlexHd placement on 12/28/2013.  She is looking at 03/15/14 for exchange surgery. We are planning to place a right breast silicone implant for symmetry at the time of her exchange surgery.   History: She was diagnosed with left breast cancer and underwent a mastectomy with chemotherapy in 2001 in [place]. She did not get radiation. She wears a 36B, is 5 feet 6 inches tall and weighs 130 pounds. She does not use tobacco. She decided on delayed reconstruction because she was not sure she wanted to do anything at the original procedure. Since then she finds it very difficult to wear clothes and a bra without the prosthetic shifting and the bra twisting. She would like to be a little bigger than now. She had both hips replaced this year and missed 3 months of work.  The left expander was filled with 40 cc of sterile saline for a total of 430/275 cc.  Past Medical History  Diagnosis Date  . PONV (postoperative nausea and vomiting)   . Bronchitis 05-12-13    occ. bouts with bronchitis  . Recurrent sinus infections     last  2 months ago  . History of frequent urinary tract infections     last tx. 1 month ago  . Benign meningioma 05-12-13    dx. '98- small, no problems  . Cancer 02/2010    left breast cancer  . History of cancer chemotherapy   . Arthritis     osteo  . Wears partial dentures     partials top and bottom    Past Surgical History  Procedure Laterality Date  . Tubal ligation    . Cholecystectomy    . Port-acath  05-12-13    insertion and removal  . Total hip arthroplasty Left 05/26/2013    Procedure: Left TOTAL HIP ARTHROPLASTY ANTERIOR APPROACH;  Surgeon: Mcarthur Rossetti, MD;  Location: [facility];  Service: Orthopedics;  Laterality: Left;  . Breast surgery Left 2011    '11- left breast mastectomy  .  Total hip arthroplasty Right 07/07/2013    Procedure: RIGHT TOTAL HIP ARTHROPLASTY ANTERIOR APPROACH;  Surgeon: Mcarthur Rossetti, MD;  Location: [facility];  Service: Orthopedics;  Laterality: Right;  . Breast reconstruction with placement of tissue expander and flex hd (acellular hydrated dermis) Left 12/28/2013    Procedure: BREAST RECONSTRUCTION WITH PLACEMENT OF TISSUE EXPANDER AND FLEX HD TO LEFT BREAST  (ACELLULAR HYDRATED DERMIS);  Surgeon: Theodoro Kos, DO;  Location: [facility];  Service: Plastics;  Laterality: Left;    Family History  Problem Relation Age of Onset  . Colon cancer Mother    Social History:  reports that she quit smoking about 3 years ago. Her smoking use included Cigarettes. She smoked 0.00 packs per day for 20 years. She has never used smokeless tobacco. She reports that she drinks alcohol. She reports that she does not use illicit drugs.  Allergies:  Allergies  Allergen Reactions  . Other     "narcotics" causes nausea and vomiting     (Not in a hospital admission)  No results found for this or any previous visit (from the past 48 hour(s)). No results found.  Review of Systems  Constitutional: Negative.   HENT: Negative.   Eyes: Negative.   Respiratory: Negative.   Cardiovascular:  Negative.   Gastrointestinal: Negative.   Genitourinary: Negative.   Musculoskeletal: Negative.   Skin: Negative.   Neurological: Negative.   Psychiatric/Behavioral: Negative.     There were no vitals taken for this visit. Physical Exam  Constitutional: She appears well-developed and well-nourished.  HENT:  Head: Normocephalic and atraumatic.  Eyes: Conjunctivae and EOM are normal. Pupils are equal, round, and reactive to light.  Cardiovascular: Normal rate.   Respiratory: Effort normal.  GI: Soft.  Musculoskeletal: Normal range of motion.  Neurological: She is alert.  Skin: Skin is warm.  Psychiatric: She has a normal mood and affect. Her behavior  is normal. Judgment and thought content normal.     Assessment/Plan Left breast expander removal and placement of implant and right breast mastopexy and implant placement.  SANGER,Lasean Rahming 03/09/2014, 1:12 PM

## 2014-03-15 NOTE — Anesthesia Procedure Notes (Signed)
Procedure Name: Intubation Date/Time: 03/15/2014 7:47 AM Performed by: Melynda Ripple D Pre-anesthesia Checklist: Patient identified, Emergency Drugs available, Suction available and Patient being monitored Patient Re-evaluated:Patient Re-evaluated prior to inductionOxygen Delivery Method: Circle System Utilized Preoxygenation: Pre-oxygenation with 100% oxygen Intubation Type: IV induction Ventilation: Mask ventilation without difficulty Laryngoscope Size: Mac and 3 Grade View: Grade I Tube type: Oral Tube size: 7.0 mm Number of attempts: 1 Airway Equipment and Method: stylet and oral airway Placement Confirmation: ETT inserted through vocal cords under direct vision,  positive ETCO2 and breath sounds checked- equal and bilateral Tube secured with: Tape Dental Injury: Teeth and Oropharynx as per pre-operative assessment

## 2014-03-15 NOTE — Interval H&P Note (Signed)
History and Physical Interval Note:  03/15/2014 7:25 AM  [name]  has presented today for surgery, with the diagnosis of HISTORY OF BREAST CANCER  The various methods of treatment have been discussed with the patient and family. After consideration of risks, benefits and other options for treatment, the patient has consented to  Procedure(s): REMOVAL OF LEFT  TISSUE EXPANDERS WITH PLACEMENT OF BILATERAL BREAST IMPLANTS  (Bilateral) RIGHT BREAST MASTOPEXY (Right) POSSIBLE LIPOSUCTION WITH LIPOFILLING (N/A) as a surgical intervention .  The patient's history has been reviewed, patient examined, no change in status, stable for surgery.  I have reviewed the patient's chart and labs.  Questions were answered to the patient's satisfaction.     SANGER,CLAIRE

## 2014-03-15 NOTE — Transfer of Care (Signed)
Immediate Anesthesia Transfer of Care Note  Patient: Mellisa Arshad  Procedure(s) Performed: Procedure(s): REMOVAL OF LEFT  TISSUE EXPANDERS WITH PLACEMENT OF BILATERAL BREAST IMPLANTS  (Bilateral) RIGHT BREAST MASTOPEXY (Right) PUNCH BIOPSY OF SKIN x2 right chest and midline chest (Right)  Patient Location: PACU  Anesthesia Type:General  Level of Consciousness: awake, alert  and oriented  Airway & Oxygen Therapy: Patient Spontanous Breathing and Patient connected to face mask oxygen  Post-op Assessment: Report given to PACU RN and Post -op Vital signs reviewed and stable  Post vital signs: Reviewed and stable  Complications: No apparent anesthesia complications

## 2014-03-15 NOTE — Brief Op Note (Signed)
03/15/2014  9:31 AM  PATIENT:  [name]  58 y.o. female  PRE-OPERATIVE DIAGNOSIS:  HISTORY OF BREAST CANCER  POST-OPERATIVE DIAGNOSIS:  HISTORY OF BREAST CANCER  PROCEDURE:  Procedure(s): REMOVAL OF LEFT  TISSUE EXPANDERS WITH PLACEMENT OF BILATERAL BREAST IMPLANTS  (Bilateral) RIGHT BREAST MASTOPEXY (Right) PUNCH BIOPSY OF SKIN x2 right chest and midline chest (Right)  SURGEON:  Surgeon(s) and Role:    * Teighan Aubert Sanger, DO - Primary  PHYSICIAN ASSISTANT: Shawn Rayburn, PA  ASSISTANTS: none   ANESTHESIA:   general  EBL:  Total I/O In: 1000 [I.V.:1000] Out: -   BLOOD ADMINISTERED:none  DRAINS: none   LOCAL MEDICATIONS USED:  LIDOCAINE   SPECIMEN:  Source of Specimen:  left mastectomy scar  DISPOSITION OF SPECIMEN:  PATHOLOGY  COUNTS:  YES  TOURNIQUET:  * No tourniquets in log *  DICTATION: .Dragon Dictation  PLAN OF CARE: Discharge to home after PACU  PATIENT DISPOSITION:  PACU - hemodynamically stable.   Delay start of Pharmacological VTE agent (>24hrs) due to surgical blood loss or risk of bleeding: no

## 2014-03-15 NOTE — Progress Notes (Signed)
Pt. States feeling nauseated. Order received for Medication.  Admin Phenergan 12.5mg  IV slow push at this time.

## 2014-03-15 NOTE — Op Note (Addendum)
Op report Unilateral Breast Exchange   DATE OF OPERATION:  03/15/2014  LOCATION: [facility]  SURGICAL DIVISION: Plastic Surgery  PREOPERATIVE DIAGNOSES:  1. History of breast cancer.  2. Acquired absence of bilateral breast.  3. Changing skin lesion of chest x 2.  POSTOPERATIVE DIAGNOSES:  1. History of breast cancer.  2. Acquired absence of bilateral breast.  3. Changing skin lesion of chest x 2.  PROCEDURE:  1. Bilateral exchange of tissue expanders for implants.  2. Bilateral capsulotomies for implant respositioning. 3. Punch biopsy of changing skin lesions of chest x 2 (3 mm)  SURGEON: [employer], DO  ASSISTANT: Shawn Rayburn, PA  ANESTHESIA:  General.   COMPLICATIONS: None.   IMPLANTS: Left - Mentor Smooth Round Ultra High Profile Gel 400cc. Ref #[id number].  Serial Number [id number] Right - [employer] Smooth Round Medium Profile Gel 125cc. Ref #[id number].  Serial Number [id number]  INDICATIONS FOR PROCEDURE:  The patient, [name], is a 58 y.o. female born on December 12, 1956, is here for treatment for further treatment after a mastectomy and placement of a tissue expander. She now presents for exchange of her expander for an implant.  She requires capsulotomies to better position the implant.  Placement of implant on the right breast for symmetry.   CONSENT:  Informed consent was obtained directly from the patient. Risks, benefits and alternatives were fully discussed. Specific risks including but not limited to bleeding, infection, hematoma, seroma, scarring, pain, implant infection, implant extrusion, capsular contracture, asymmetry, wound healing problems, and need for further surgery were all discussed. The patient did have an ample opportunity to have her questions answered to her satisfaction.   DESCRIPTION OF PROCEDURE:  The patient was taken to the operating room. SCDs were placed and IV antibiotics were given. The patient's chest was prepped  and draped in a sterile fashion. A time out was performed and the implants to be used were identified.  One percent Xylocaine with epinephrine was used to infiltrate the area.   The old mastectomy scar was opened and superior mastectomy and inferior mastectomy flaps were re-raised over the pectoralis major muscle. The pectoralis was split to expose the tissue expander which was removed. Inspection of the pocket showed a normal healthy capsule and good integration of the biologic matrix.  Circumferential capsulotomies were performed on each breast to allow for breast pocket expansion on either side.  Measurements were made on either side to confirm adequate pocket size for the implant dimensions.  Hemostasis was ensured.  Gloves were changed. The implants were placed in the pockets and oriented appropriately. The pectoralis major muscle and capsule on the anterior surface were re-closed with a 3-0 running Vicryl suture. The remaining skin was closed with 4-0 Monocryl deep dermal and 5-0 Monocryl subcuticular stitches.  Dermabond was applied.   A punch biopsy was done of the chest x 2 for changing skin lesions with a 3 mm punch.  The area was closed with 6-0 Monocryl and steri strips were applied.   The right breast was marked preoperatively.  The area was injected with local.  The periareolar area was de-epithelialized.  The bovie was used to free the NAC and raise it to the superior medial aspect.  The bovie was used to dissect to the chest wall and lift the pectoralis muscle.  The sizer was placed and the patient was sat up to check for size and symmetry.  The decision was made for the 125 cc implant.  The pectoralis muscle  was sutured to the serratus to keep the implant high and medial.  The pillards were brought together with 3-0 PDS.  The nipple areola was sutured into place with 4-0 Monocryl and Vicryl.  The skin was closed with 5-0 Monocryl.  The vertical limb was closed with 4-0 Moncryl followed by  5-0 Monocryl. Dermabond was applied and steri strips.   A breast binder and ABD was applied.  The patient was allowed to wake from anesthesia and taken to the recovery room in satisfactory condition.

## 2014-03-15 NOTE — Anesthesia Preprocedure Evaluation (Signed)
Anesthesia Evaluation  Patient identified by MRN, date of birth, ID band Patient awake    Reviewed: Allergy & Precautions, H&P , NPO status , Patient's Chart, lab work & pertinent test results  History of Anesthesia Complications (+) PONV  Airway Mallampati: I TM Distance: >3 FB Neck ROM: Full    Dental  (+) Teeth Intact, Partial Lower, Partial Upper, Dental Advisory Given   Pulmonary former smoker,  breath sounds clear to auscultation        Cardiovascular Rhythm:Regular Rate:Normal     Neuro/Psych    GI/Hepatic   Endo/Other    Renal/GU      Musculoskeletal   Abdominal   Peds  Hematology   Anesthesia Other Findings   Reproductive/Obstetrics                           Anesthesia Physical Anesthesia Plan  ASA: II  Anesthesia Plan: General   Post-op Pain Management:    Induction: Intravenous  Airway Management Planned: Oral ETT  Additional Equipment:   Intra-op Plan:   Post-operative Plan: Extubation in OR  Informed Consent: I have reviewed the patients History and Physical, chart, labs and discussed the procedure including the risks, benefits and alternatives for the proposed anesthesia with the patient or authorized representative who has indicated his/her understanding and acceptance.   Dental advisory given  Plan Discussed with: CRNA, Anesthesiologist and Surgeon  Anesthesia Plan Comments:         Anesthesia Quick Evaluation

## 2014-03-15 NOTE — Anesthesia Postprocedure Evaluation (Signed)
  Anesthesia Post-op Note  Patient: [name]  Procedure(s) Performed: Procedure(s): REMOVAL OF LEFT  TISSUE EXPANDERS WITH PLACEMENT OF BILATERAL BREAST IMPLANTS  (Bilateral) RIGHT BREAST MASTOPEXY (Right) PUNCH BIOPSY OF SKIN x2 right chest and midline chest (Right)  Patient Location: PACU  Anesthesia Type:General  Level of Consciousness: awake, alert  and oriented  Airway and Oxygen Therapy: Patient Spontanous Breathing  Post-op Pain: mild  Post-op Assessment: Post-op Vital signs reviewed  Post-op Vital Signs: Reviewed  Complications: No apparent anesthesia complications

## 2014-03-16 ENCOUNTER — Encounter (HOSPITAL_BASED_OUTPATIENT_CLINIC_OR_DEPARTMENT_OTHER): Payer: Self-pay | Admitting: Plastic Surgery

## 2014-03-27 DIAGNOSIS — Z9889 Other specified postprocedural states: Secondary | ICD-10-CM | POA: Insufficient documentation

## 2014-03-27 DIAGNOSIS — Z853 Personal history of malignant neoplasm of breast: Secondary | ICD-10-CM | POA: Insufficient documentation

## 2014-03-27 DIAGNOSIS — Z9882 Breast implant status: Secondary | ICD-10-CM | POA: Insufficient documentation

## 2014-09-03 DIAGNOSIS — J45909 Unspecified asthma, uncomplicated: Secondary | ICD-10-CM | POA: Insufficient documentation

## 2014-09-18 DIAGNOSIS — J309 Allergic rhinitis, unspecified: Secondary | ICD-10-CM | POA: Insufficient documentation

## 2015-12-22 HISTORY — PX: OTHER SURGICAL HISTORY: SHX169

## 2016-06-05 DIAGNOSIS — Z170 Estrogen receptor positive status [ER+]: Secondary | ICD-10-CM | POA: Diagnosis not present

## 2016-06-05 DIAGNOSIS — M810 Age-related osteoporosis without current pathological fracture: Secondary | ICD-10-CM | POA: Diagnosis not present

## 2016-06-05 DIAGNOSIS — Z9012 Acquired absence of left breast and nipple: Secondary | ICD-10-CM | POA: Diagnosis not present

## 2016-06-05 DIAGNOSIS — Z853 Personal history of malignant neoplasm of breast: Secondary | ICD-10-CM | POA: Diagnosis not present

## 2016-11-30 ENCOUNTER — Telehealth (INDEPENDENT_AMBULATORY_CARE_PROVIDER_SITE_OTHER): Payer: Self-pay | Admitting: Orthopaedic Surgery

## 2016-11-30 NOTE — Telephone Encounter (Signed)
Patient calling about a faxed handicap placard form  that was "re-faxed" to you Dec 7th.  She was unable to read the first one that was filled out.  She is asking if you would rewrite so it is legible and fax to following number:  [phone]

## 2016-12-01 NOTE — Telephone Encounter (Signed)
Faxed to her

## 2017-03-23 ENCOUNTER — Telehealth (INDEPENDENT_AMBULATORY_CARE_PROVIDER_SITE_OTHER): Payer: Self-pay | Admitting: Orthopaedic Surgery

## 2017-03-23 NOTE — Telephone Encounter (Signed)
Patient called needing a letter updating  her work status. She advised the date just need to be changed. The number to contact patient is [phone]

## 2017-03-24 ENCOUNTER — Encounter (INDEPENDENT_AMBULATORY_CARE_PROVIDER_SITE_OTHER): Payer: Self-pay

## 2017-03-24 NOTE — Telephone Encounter (Signed)
Patient will email me old note to copy and send to her

## 2017-04-20 HISTORY — PX: SINOSCOPY: SHX187

## 2017-07-01 DIAGNOSIS — Z170 Estrogen receptor positive status [ER+]: Secondary | ICD-10-CM | POA: Diagnosis not present

## 2017-07-01 DIAGNOSIS — Z9012 Acquired absence of left breast and nipple: Secondary | ICD-10-CM | POA: Diagnosis not present

## 2017-07-01 DIAGNOSIS — Z853 Personal history of malignant neoplasm of breast: Secondary | ICD-10-CM | POA: Diagnosis not present

## 2017-07-01 DIAGNOSIS — M810 Age-related osteoporosis without current pathological fracture: Secondary | ICD-10-CM | POA: Diagnosis not present

## 2017-08-03 ENCOUNTER — Ambulatory Visit (INDEPENDENT_AMBULATORY_CARE_PROVIDER_SITE_OTHER): Payer: Managed Care, Other (non HMO) | Admitting: Physician Assistant

## 2017-08-03 ENCOUNTER — Ambulatory Visit (INDEPENDENT_AMBULATORY_CARE_PROVIDER_SITE_OTHER): Payer: Self-pay

## 2017-08-03 DIAGNOSIS — M722 Plantar fascial fibromatosis: Secondary | ICD-10-CM | POA: Diagnosis not present

## 2017-08-03 DIAGNOSIS — M7061 Trochanteric bursitis, right hip: Secondary | ICD-10-CM

## 2017-08-03 MED ORDER — LIDOCAINE HCL 1 % IJ SOLN
3.0000 mL | INTRAMUSCULAR | Status: AC | PRN
  Administered 2017-08-03: 3 mL

## 2017-08-03 MED ORDER — METHYLPREDNISOLONE ACETATE 40 MG/ML IJ SUSP
40.0000 mg | INTRAMUSCULAR | Status: AC | PRN
  Administered 2017-08-03: 40 mg via INTRA_ARTICULAR

## 2017-08-03 NOTE — Progress Notes (Signed)
Office Visit Note   Patient: [name]           Date of Birth: [birth year]           MRN: [id number] Visit Date: 08/03/2017              Requested by: Imagene Riches, NP [facility], [place] [address] PCP: Imagene Riches, NP   Assessment & Plan: Visit Diagnoses:  1. Trochanteric bursitis, right hip   2. Plantar fasciitis     Plan: Discussed shoe wear with her she still would any open back shoes even around the house. She's change her shoes daily. Gastrocsoleus stretching exercises demonstrated and reviewed with patient. May consider injection for plantar fasciitis in the future pain persists or becomes worse. IT band stretching exercises shown she deformities on both hips. We'll see her back on an as-needed basis pain persist or becomes worse. Did talk to her about taking an over-the-counter NSAID for at least next 2 weeks to help things calm down.  Follow-Up Instructions: Return if symptoms worsen or fail to improve.   Orders:  Orders Placed This Encounter  Procedures  . Large Joint Injection/Arthrocentesis  . XR HIP UNILAT W OR W/O PELVIS 1V RIGHT  . XR Foot Complete Right   No orders of the defined types were placed in this encounter.     Procedures: Large Joint Inj Date/Time: 08/03/2017 10:44 AM Performed by: Pete Pelt Authorized by: Pete Pelt   Consent Given by:  Patient Indications:  Pain Location:  Hip Site:  R greater trochanter Needle Size:  22 G Needle Length:  1.5 inches Approach:  Lateral Ultrasound Guidance: No   Fluoroscopic Guidance: No   Arthrogram: No   Medications:  40 mg methylPREDNISolone acetate 40 MG/ML; 3 mL lidocaine 1 % Aspiration Attempted: No   Patient tolerance:  Patient tolerated the procedure well with no immediate complications     Clinical Data: No additional findings.   Subjective: Bilateral foot pain right greater than left  Right hip pain  HPI Mrs. Wren is well-known Dr. Trevor Mace service  status post right total hip and left total hip 4. He comes in today due to right hip pain and right foot pain. States right hip pain is been ongoing for the past several weeks. No known injury. She has pain in the groin but also on the lateral and buttocks aspect of the hip. She's had some radicular symptoms down both legs in the past but none currently. She reports having a vertebral plasty over a year ago. She's also having tenderness bilateral feet particularly whenever she first gets up in the morning. She does notice swelling lateral aspect of the right ankle. She notes she has a history of a right foot fracture some time ago was treated conservatively in a postop shoe.    Review of Systems Please see history of present illness otherwise negative  Objective: Vital Signs: There were no vitals taken for this visit.  Physical Exam  Ortho Exam Bilateral feet she has tenderness over the medial tubercle of calcaneus. Right foot she has tenderness over the peroneal tendons slight tenderness over the sinus tarsi region. Left ankle she has tenderness over the proximal posterior tibial tendon. She has 5 out of 5 strengths though with inversion eversion against resistance bilaterally. Good dorsiflexion plantar flexion bilateral ankles. Slight edema right ankle bilaterally. No tenderness over the medial lateral malleolus bilaterally. Achilles nontender bilaterally. Bilateral hip she has good  range of motion of both hips. Extremes of the right hip causes discomfort. She has tenderness over the right trochanteric region. Specialty Comments:  No specialty comments available.  Imaging: Xr Hip Unilat W Or W/o Pelvis 1v Right  Result Date: 08/03/2017 The pelvis lateral view of the right hip shows no acute fractures. Bilateral hip replacements well-seated. No eccentric wear is noted either hip.  Xr Foot Complete Right  Result Date: 08/03/2017 Right foot 3 views: No acute fracture. Well-healed second  metatarsal fracture. Small calcaneal heel spur. Otherwise no bony abnormalities. Lisfranc joints are all well maintained    PMFS History: Patient Active Problem List   Diagnosis Date Noted  . Plantar fasciitis 08/03/2017  . 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 Arthritis pain of hip 07/07/2013  . Postoperative anemia due to acute blood loss 05/28/2013    Class: Acute  . Degenerative arthritis of hip 05/26/2013   Past Medical History:  Diagnosis Date  . Arthritis    osteo  . Benign meningioma 05-12-13   dx. '98- small, no problems  . Bronchitis 05-12-13   occ. bouts with bronchitis  . Cancer 02/2010   left breast cancer  . History of cancer chemotherapy   . History of frequent urinary tract infections    last tx. 1 month ago  . PONV (postoperative nausea and vomiting)   . Recurrent sinus infections    last  2 months ago  . Wears partial dentures    partials top and bottom    Family History  Problem Relation Age of Onset  . Colon cancer Mother     Past Surgical History:  Procedure Laterality Date  . BREAST RECONSTRUCTION WITH PLACEMENT OF TISSUE EXPANDER AND FLEX HD (ACELLULAR HYDRATED DERMIS) Left 12/28/2013   Procedure: BREAST RECONSTRUCTION WITH PLACEMENT OF TISSUE EXPANDER AND FLEX HD TO LEFT BREAST  (ACELLULAR HYDRATED DERMIS);  Surgeon: Theodoro Kos, DO;  Location: [facility];  Service: Plastics;  Laterality: Left;  . BREAST SURGERY Left 2011   '11- left breast mastectomy  . CHOLECYSTECTOMY    . MASTOPEXY Right 03/15/2014   Procedure: RIGHT BREAST MASTOPEXY;  Surgeon: Theodoro Kos, DO;  Location: [facility];  Service: Plastics;  Laterality: Right;  . port-acath  05-12-13   insertion and removal  . PUNCH BIOPSY OF SKIN Right 03/15/2014   Procedure: PUNCH BIOPSY OF SKIN x2 right chest and midline chest;  Surgeon: Theodoro Kos, DO;  Location: [facility];  Service: Plastics;  Laterality: Right;  . REMOVAL OF  BILATERAL TISSUE EXPANDERS WITH PLACEMENT OF BILATERAL BREAST IMPLANTS Bilateral 03/15/2014   Procedure: REMOVAL OF LEFT  TISSUE EXPANDERS WITH PLACEMENT OF BILATERAL BREAST IMPLANTS ;  Surgeon: Theodoro Kos, DO;  Location: [facility];  Service: Plastics;  Laterality: Bilateral;  . TOTAL HIP ARTHROPLASTY Left 05/26/2013   Procedure: Left TOTAL HIP ARTHROPLASTY ANTERIOR APPROACH;  Surgeon: Mcarthur Rossetti, MD;  Location: [facility];  Service: Orthopedics;  Laterality: Left;  . TOTAL HIP ARTHROPLASTY Right 07/07/2013   Procedure: RIGHT TOTAL HIP ARTHROPLASTY ANTERIOR APPROACH;  Surgeon: Mcarthur Rossetti, MD;  Location: [facility];  Service: Orthopedics;  Laterality: Right;  . TUBAL LIGATION     Social History   Occupational History  . Not on file.   Social History Main Topics  . Smoking status: Former Smoker    Years: 20.00    Types: Cigarettes    Quit date: 05/12/2010  . Smokeless tobacco: Never  Used  . Alcohol use Yes     Comment: social  . Drug use: No  . Sexual activity: Not Currently

## 2018-02-17 ENCOUNTER — Encounter: Payer: Self-pay | Admitting: Allergy and Immunology

## 2018-02-17 ENCOUNTER — Ambulatory Visit: Payer: Managed Care, Other (non HMO) | Admitting: Allergy and Immunology

## 2018-02-17 VITALS — BP 136/82 | HR 72 | Temp 98.3°F | Resp 20 | Ht 63.0 in | Wt 143.6 lb

## 2018-02-17 DIAGNOSIS — J3089 Other allergic rhinitis: Secondary | ICD-10-CM | POA: Diagnosis not present

## 2018-02-17 DIAGNOSIS — J324 Chronic pansinusitis: Secondary | ICD-10-CM

## 2018-02-17 DIAGNOSIS — J4520 Mild intermittent asthma, uncomplicated: Secondary | ICD-10-CM | POA: Diagnosis not present

## 2018-02-17 DIAGNOSIS — K219 Gastro-esophageal reflux disease without esophagitis: Secondary | ICD-10-CM

## 2018-02-17 MED ORDER — RANITIDINE HCL 300 MG PO TABS: 300.0000 mg | ORAL_TABLET | Freq: Every day | ORAL | 5 refills | Status: AC

## 2018-02-17 MED ORDER — IPRATROPIUM BROMIDE 0.06 % NA SOLN: 2.0000 | Freq: Four times a day (QID) | NASAL | 5 refills | Status: AC | PRN

## 2018-02-17 MED ORDER — OMEPRAZOLE 40 MG PO CPDR: 40.0000 mg | DELAYED_RELEASE_CAPSULE | Freq: Every day | ORAL | 5 refills | Status: AC

## 2018-02-17 MED ORDER — MONTELUKAST SODIUM 10 MG PO TABS: 10.0000 mg | ORAL_TABLET | Freq: Every day | ORAL | 5 refills | Status: AC

## 2018-02-17 NOTE — Patient Instructions (Addendum)
  1.  Allergen avoidance measures?  2.  Treat and prevent inflammation:   A.  OTC Nasacort 1 spray each nostril once a day  B.  Montelukast 10 mg tablet once a day  3.  Treat and prevent reflux:   A.  Consolidate all chocolate and other sources of caffeine consumption  B.  Omeprazole 40 mg tablet in a.m.  C.  Ranitidine 300 mg tablet in p.m.   4.  If needed:   A.  Nasal saline irrigation  B.  Ipratropium 0.06% 1-2 sprays each nostril every 6 hours  C.  OTC Systane eyedrops multiple times a day  D.  Pro Air HFA 2 puffs every 4-6 hours  5.  Blood - CBC w/diff, IgA/G/M, IgE, anti-pneumococcal ab, anti-tetanus ab  6.  Return to clinic in 4 weeks or earlier if problem

## 2018-02-17 NOTE — Progress Notes (Signed)
Dear [name],  Thank you for referring [name] to the [facility] on 02/17/2018.   Below is a summation of this patient's evaluation and recommendations.  Thank you for your referral. I will keep you informed about this patient's response to treatment.   If you have any questions please do not hesitate to contact me.   Sincerely,  Jiles Prows, MD Allergy / Immunology [facility]   ______________________________________________________________________    NEW PATIENT NOTE  Referring Provider: Imagene Riches, NP Primary Provider: Imagene Riches, NP Date of office visit: 02/17/2018    Subjective:   Chief Complaint:  [name] (DOB: [birth year]) is a 62 y.o. female who presents to the clinic on 02/17/2018 with a chief complaint of Asthma (wheezing) and Sinus Problem (runny nose, PND) .     HPI: [name] presents to this clinic in evaluation several issues.  First, she has a long history of upper airway disease manifested as nasal congestion and yellow nasal discharge upon irrigating in the morning and some occasional sneezing and chronic runny nose.  She does not have any associated anosmia or headaches.  Evaluation and treatment in the past has included the documentation of chronic sinusitis and evaluation by Dr. Gaylyn Cheers, ENT, and performance of Sino plasty and repair of a deviated septum in May 2018.  She is not sure that this has helped her at all although it may have resolved the gasping that she had at nighttime.  In addition to her airway issue she does have watery eyes and saw a ophthalmologist recently who told her that she has dry eye syndrome and recommended that she use Systane.  In addition, she does have the sensation of "ear fullness" without any vertigo or tinnitus and apparently she has had formal hearing testing which is normal.  Second, she has been given an inhaler for  "asthma".  However, she has no exercise-induced bronchospastic symptoms are cold air induced bronchospastic symptoms and she has not used an inhaler in greater than a she does cough but her coughing is associated with throat clearing and constant drainage down her throat and a "congested throat".  Her voice does get raspy.    Third, she does have reflux very high up in her chest.  In addition she has an issue with swallowing.  At least one time per month she will get things "hung up" in her chest for about 5 minutes and she drinks milk to clear this issue.  It should be noted that she is using alendronate on a weekly basis.  Fourth, she was recently diagnosed with eczema and given a topical steroid cream by her dermatologist recently.  Past Medical History:  Diagnosis Date  . Arthritis    osteo  . Asthma   . Benign meningioma ([facility]) 05-12-13   dx. '98- small, no problems  . Bronchitis 05-12-13   occ. bouts with bronchitis  . Cancer ([facility]) 02/2010   left breast cancer  . History of cancer chemotherapy   . History of frequent urinary tract infections    last tx. 1 month ago  . PONV (postoperative nausea and vomiting)   . Recurrent sinus infections    last  2 months ago  . Wears partial dentures    partials top and bottom    Past Surgical History:  Procedure Laterality Date  . back kyoplasty  2017  . BREAST RECONSTRUCTION  WITH PLACEMENT OF TISSUE EXPANDER AND FLEX HD (ACELLULAR HYDRATED DERMIS) Left 12/28/2013   Procedure: BREAST RECONSTRUCTION WITH PLACEMENT OF TISSUE EXPANDER AND FLEX HD TO LEFT BREAST  (ACELLULAR HYDRATED DERMIS);  Surgeon: Theodoro Kos, DO;  Location: [facility];  Service: Plastics;  Laterality: Left;  . BREAST SURGERY Left 2011   '11- left breast mastectomy  . CHOLECYSTECTOMY    . MASTOPEXY Right 03/15/2014   Procedure: RIGHT BREAST MASTOPEXY;  Surgeon: Theodoro Kos, DO;  Location: [facility];  Service: Plastics;  Laterality: Right;  .  port-acath  05-12-13   insertion and removal  . PUNCH BIOPSY OF SKIN Right 03/15/2014   Procedure: PUNCH BIOPSY OF SKIN x2 right chest and midline chest;  Surgeon: Theodoro Kos, DO;  Location: [facility];  Service: Plastics;  Laterality: Right;  . REMOVAL OF BILATERAL TISSUE EXPANDERS WITH PLACEMENT OF BILATERAL BREAST IMPLANTS Bilateral 03/15/2014   Procedure: REMOVAL OF LEFT  TISSUE EXPANDERS WITH PLACEMENT OF BILATERAL BREAST IMPLANTS ;  Surgeon: Theodoro Kos, DO;  Location: [facility];  Service: Plastics;  Laterality: Bilateral;  . SINOSCOPY  04/2017  . TOTAL HIP ARTHROPLASTY Left 05/26/2013   Procedure: Left TOTAL HIP ARTHROPLASTY ANTERIOR APPROACH;  Surgeon: Mcarthur Rossetti, MD;  [facility];  Service: Orthopedics;  Laterality: Left;  . TOTAL HIP ARTHROPLASTY Right 07/07/2013   Procedure: RIGHT TOTAL HIP ARTHROPLASTY ANTERIOR APPROACH;  Surgeon: Mcarthur Rossetti, MD;  [facility];  Service: Orthopedics;  Laterality: Right;  . TUBAL LIGATION      Allergies as of 02/17/2018      Reactions   Codeine Nausea Only   Meperidine Nausea Only   Other    "narcotics" causes nausea and vomiting      Medication List      alendronate 70 MG tablet Commonly known as:  FOSAMAX Take 70 mg by mouth every Sunday. Take with a full glass of water on an empty stomach.   Calcium-D 600-400 MG-UNIT Tabs Take 1 tablet by mouth 2 (two) times daily.   Fish Oil 500 MG Caps Take 1 capsule by mouth daily.   Melatonin 10 MG Tabs Take 2 tablets by mouth at bedtime.   multivitamin with minerals Tabs tablet Take 1 tablet by mouth daily.   PROAIR HFA 108 (90 Base) MCG/ACT inhaler Generic drug:  albuterol Inhale into the lungs.   Turmeric Curcumin 500 MG Caps Take 1 tablet by mouth daily.   vitamin C 500 MG tablet Commonly known as:  ASCORBIC ACID Take 500 mg by mouth daily.       Review of systems negative except as noted in HPI / PMHx or noted  below:  Review of Systems  Constitutional: Negative.   HENT: Negative.   Eyes: Negative.   Respiratory: Negative.   Cardiovascular: Negative.   Gastrointestinal: Negative.   Genitourinary: Negative.   Musculoskeletal: Negative.   Skin: Negative.   Neurological: Negative.   Endo/Heme/Allergies: Negative.   Psychiatric/Behavioral: Negative.     Family History  Problem Relation Age of Onset  . Colon cancer Mother   . High blood pressure Sister   . High blood pressure Sister     Social History   Socioeconomic History  . Marital status: Single    Spouse name: Not on file  . Number of children: Not on file  . Years of education: Not on file  . Highest education level: Not on file  Social Needs  . Emergency planning/management officer  strain: Not on file  . Food insecurity - worry: Not on file  . Food insecurity - inability: Not on file  . Transportation needs - medical: Not on file  . Transportation needs - non-medical: Not on file  Occupational History  . Not on file  Tobacco Use  . Smoking status: Former Smoker    Packs/day: 1.00    Years: 20.00    Pack years: 20.00    Types: Cigarettes    Last attempt to quit: 05/12/2010    Years since quitting: 7.7  . Smokeless tobacco: Never Used  Substance and Sexual Activity  . Alcohol use: Yes    Comment: social  . Drug use: No  . Sexual activity: Not Currently  Other Topics Concern  . Not on file  Social History Narrative  . Not on file    Environmental and Social history  Lives in a apartment with a dry environment although occasionally there will be mildew on the windowsill, no animals located inside the household, no carpet in the bedroom, no plastic on the bed, no plastic on the pillow, and no smokers located inside the household.  She works in an office setting.  Objective:   Vitals:   02/17/18 0937  BP: 136/82  Pulse: 72  Resp: 20  Temp: 98.3 F (36.8 C)   Height: 5\' 3"  (160 cm) Weight: 143 lb 9.6 oz (65.1  kg)  Physical Exam  Constitutional: She is well-developed, well-nourished, and in no distress.  Deep raspy voice, throat clearing, slight cough  HENT:  Head: Normocephalic. Head is without right periorbital erythema and without left periorbital erythema.  Right Ear: Tympanic membrane, external ear and ear canal normal.  Left Ear: Tympanic membrane, external ear and ear canal normal.  Nose: Nose normal. No mucosal edema or rhinorrhea.  Mouth/Throat: Oropharynx is clear and moist and mucous membranes are normal. No oropharyngeal exudate.  Eyes: Conjunctivae and lids are normal. Pupils are equal, round, and reactive to light.  Neck: Trachea normal. No tracheal deviation present. No thyromegaly present.  Cardiovascular: Normal rate, regular rhythm, S1 normal, S2 normal and normal heart sounds.  No murmur heard. Pulmonary/Chest: Effort normal. No stridor. No tachypnea. No respiratory distress. She has no wheezes. She has no rales. She exhibits no tenderness.  Abdominal: Soft. She exhibits no distension and no mass. There is no hepatosplenomegaly. There is no tenderness. There is no rebound and no guarding.  Musculoskeletal: She exhibits no edema or tenderness.  Lymphadenopathy:       Head (right side): No tonsillar adenopathy present.       Head (left side): No tonsillar adenopathy present.    She has no cervical adenopathy.    She has no axillary adenopathy.  Neurological: She is alert. Gait normal.  Skin: No rash noted. She is not diaphoretic. No erythema. No pallor. Nails show no clubbing.  Psychiatric: Mood and affect normal.    Diagnostics: Allergy skin tests were performed.  She did not demonstrate any hypersensitivity against a screening panel of aeroallergens or foods.  Spirometry was performed and demonstrated an FEV1 of 2.05 @ 82 % of predicted. FEV1/FVC = 0.78.  Following administration of nebulized albuterol her FEV1 did increase to 2.57 which was an increase in the FEV1 of 25  percent.  It should be noted that she had some coughing during this exam and her technique was not the best during this expiratory maneuver because of coughing.  Results of a sinus CT scan obtained 06 February 2017 identified the following:  Frontal sinuses and frontoethmoidal recesses are largely opacified bilaterally, perhaps slightly worse on the right. Central hyperdensity within the right frontal sinus compatible with desiccated secretions and/ or superimposed fungal elements, compatible with chronic sinus disease. Inner and outer tables are intact without evidence for osseous dehiscence. Frontoethmoidal recesses are opacified. Both of the anterior and posterior ethmoidal air cells are largely opacified with mucosal thickening, left slightly worse than right. Lamina papyracea intact. Mild to moderate mucosal thickening within the sphenoid sinuses bilaterally, left worse than right. Left sphenoid sinus slightly dominant. Sphenoethmoidal foramina opacified.  Mild-to-moderate circumferential mucosal thickening within the maxillary sinuses bilaterally, worse on the left. Superimposed desiccated secretions/ fungal elements also noted within the left maxillary sinus. Mucosal thickening extends into the ostiomeatal units bilaterally which are largely opacified. No obstructive Haller cell or other abnormality. Pre maxillary and retro antral fat R preserved.  Mild leftward bowing of the nasal septum. No associated concha bullosa. Mild mucosal hypertrophy about the middle and inferior turbinates. Nasal cavities otherwise largely clear.  No osseous dehiscence. No frank air-fluid level suggests acute on chronic sinusitis.  Mastoid air cells and middle ear cavities are clear.  Globes and orbital soft tissues within normal limits.  Visualized intracranial contents unremarkable.  Remainder the visualized soft tissues of the face unremarkable.   Assessment and Plan:    1. Asthma,  mild intermittent, well-controlled   2. Other allergic rhinitis   3. Chronic pansinusitis   4. LPRD (laryngopharyngeal reflux disease)     1.  Allergen avoidance measures?  2.  Treat and prevent inflammation:   A.  OTC Nasacort 1 spray each nostril once a day  B.  Montelukast 10 mg tablet once a day  3.  Treat and prevent reflux:   A.  Consolidate all chocolate and other sources of caffeine consumption  B.  Omeprazole 40 mg tablet in a.m.  C.  Ranitidine 300 mg tablet in p.m.   4.  If needed:   A.  Nasal saline irrigation  B.  Ipratropium 0.06% 1-2 sprays each nostril every 6 hours  C.  OTC Systane eyedrops multiple times a day  D.  Pro Air HFA 2 puffs every 4-6 hours  5.  Blood - CBC w/diff, IgA/G/M, IgE, anti-pneumococcal ab, anti-tetanus ab  6.  Return to clinic in 4 weeks or earlier if problem  [name] appears to have an inflamed and irritated respiratory tract.  I think we are obligated to rule out a possible defect in her B-cell immune system with the blood tests noted above given the fact that she does have a component of chronic sinusitis requiring a surgical procedure in 2018.  As well, I think the greatest insult to her airway is probably reflux and we will treat that issue aggressively for the next 4 weeks and see how she does with this approach.  There may be a component of asthma but I still think that the major insult to her airway is reflux and we will withhold the use of any anti-inflammatory medications for her lower airway at this point.  Jiles Prows, MD Allergy / Immunology [facility]

## 2018-02-21 ENCOUNTER — Encounter: Payer: Self-pay | Admitting: Allergy and Immunology

## 2018-02-24 LAB — CBC WITH DIFFERENTIAL
BASOS: 1 %
Basophils Absolute: 0 10*3/uL (ref 0.0–0.2)
EOS (ABSOLUTE): 0.1 10*3/uL (ref 0.0–0.4)
Eos: 1 %
Hematocrit: 40.5 % (ref 34.0–46.6)
Hemoglobin: 13.2 g/dL (ref 11.1–15.9)
Immature Grans (Abs): 0 10*3/uL (ref 0.0–0.1)
Immature Granulocytes: 0 %
LYMPHS ABS: 1.5 10*3/uL (ref 0.7–3.1)
Lymphs: 23 %
MCH: 31.4 pg (ref 26.6–33.0)
MCHC: 32.6 g/dL (ref 31.5–35.7)
MCV: 96 fL (ref 79–97)
Monocytes Absolute: 0.6 10*3/uL (ref 0.1–0.9)
Monocytes: 9 %
NEUTROS PCT: 66 %
Neutrophils Absolute: 4.4 10*3/uL (ref 1.4–7.0)
RBC: 4.21 x10E6/uL (ref 3.77–5.28)
RDW: 13.2 % (ref 12.3–15.4)
WBC: 6.5 10*3/uL (ref 3.4–10.8)

## 2018-02-24 LAB — STREP PNEUMONIAE 14 SEROTYPES IGG
PNEUMO AB TYPE 19 (19F): 5.2 ug/mL (ref >1.3)
PNEUMO AB TYPE 26 (6B): 6.2 ug/mL (ref >1.3)
PNEUMO AB TYPE 3: 2.5 ug/mL (ref >1.3)
Pneumo Ab Type 1*: 3.3 ug/mL (ref >1.3)
Pneumo Ab Type 12 (12F)*: 0.8 ug/mL — ABNORMAL LOW (ref >1.3)
Pneumo Ab Type 14*: >21.7 ug/mL (ref >1.3)
Pneumo Ab Type 23 (23F)*: 9.2 ug/mL (ref >1.3)
Pneumo Ab Type 4*: 2.9 ug/mL (ref >1.3)
Pneumo Ab Type 57 (19A)*: 1.1 ug/mL — ABNORMAL LOW (ref >1.3)
Pneumo Ab Type 8*: 0.6 ug/mL — ABNORMAL LOW (ref >1.3)
Pneumo Ab Type 9 (9N)*: 1.9 ug/mL (ref >1.3)

## 2018-02-24 LAB — IGG, IGA, IGM
IGM (IMMUNOGLOBULIN M), SRM: 117 mg/dL (ref 26–217)
IgA/Immunoglobulin A, Serum: 147 mg/dL (ref 87–352)
IgG (Immunoglobin G), Serum: 1206 mg/dL (ref 700–1600)

## 2018-02-24 LAB — IGE: IgE (Immunoglobulin E), Serum: 50 IU/mL (ref 0–100)

## 2018-02-24 LAB — TETANUS ANTIBODY, IGG: Tetanus Ab, IgG: 3.74 IU/mL (ref <0.10)

## 2018-03-23 ENCOUNTER — Ambulatory Visit (INDEPENDENT_AMBULATORY_CARE_PROVIDER_SITE_OTHER): Payer: Managed Care, Other (non HMO) | Admitting: Orthopaedic Surgery

## 2018-03-23 ENCOUNTER — Encounter (INDEPENDENT_AMBULATORY_CARE_PROVIDER_SITE_OTHER): Payer: Self-pay | Admitting: Orthopaedic Surgery

## 2018-03-23 DIAGNOSIS — M5441 Lumbago with sciatica, right side: Secondary | ICD-10-CM | POA: Insufficient documentation

## 2018-03-23 DIAGNOSIS — M25512 Pain in left shoulder: Secondary | ICD-10-CM

## 2018-03-23 DIAGNOSIS — M7061 Trochanteric bursitis, right hip: Secondary | ICD-10-CM | POA: Diagnosis not present

## 2018-03-23 MED ORDER — LIDOCAINE HCL 1 % IJ SOLN
3.0000 mL | INTRAMUSCULAR | Status: AC | PRN
  Administered 2018-03-23: 3 mL

## 2018-03-23 MED ORDER — METHYLPREDNISOLONE ACETATE 40 MG/ML IJ SUSP
40.0000 mg | INTRAMUSCULAR | Status: AC | PRN
  Administered 2018-03-23: 40 mg via INTRA_ARTICULAR

## 2018-03-23 NOTE — Progress Notes (Signed)
Office Visit Note   Patient: [name]           Date of Birth: December 04, 1956           MRN: [id number] Visit Date: 03/23/2018              Requested by: Imagene Riches, NP [address] [place], [place] [address] PCP: Imagene Riches, NP   Assessment & Plan: Visit Diagnoses:  1. Acute right-sided low back pain with right-sided sciatica   2. Acute pain of left shoulder   3. Trochanteric bursitis, right hip     Plan: Today I would like to try a subacromial injection she agreed to injections both these areas and tolerated them well.  Also showed her stretching exercises to try.  Given the nature of her sciatica and right-sided numbness and tingling I would like to obtain an MRI of her lumbar spine to rule out herniated disc.  In the left shoulder.  She understands the risk and benefits of injections and why I am prescribing this for her.  Also want to inject the trochanteric area of right hip.  We will see her back after the MRI of her lumbar spine.  All questions and concerns were answered and addressed.  She will avoid squats and lunges in the gym and avoid leg presses as well.  Follow-Up Instructions: Return in about 2 weeks (around 04/06/2018).   Orders:  Orders Placed This Encounter  Procedures  . Large Joint Inj  . Large Joint Inj   No orders of the defined types were placed in this encounter.     Procedures: Large Joint Inj: L subacromial bursa on 03/23/2018 4:51 PM Indications: pain and diagnostic evaluation Details: 22 G 1.5 in needle  Arthrogram: No  Medications: 3 mL lidocaine 1 %; 40 mg methylPREDNISolone acetate 40 MG/ML Outcome: tolerated well, no immediate complications Procedure, treatment alternatives, risks and benefits explained, specific risks discussed. Consent was given by the patient. Immediately prior to procedure a time out was called to verify the correct patient, procedure, equipment, support staff and site/side marked as required. Patient was prepped and  draped in the usual sterile fashion.   Large Joint Inj: R greater trochanter on 03/23/2018 4:51 PM Indications: pain and diagnostic evaluation Details: 22 G 1.5 in needle, lateral approach  Arthrogram: No  Medications: 3 mL lidocaine 1 %; 40 mg methylPREDNISolone acetate 40 MG/ML Outcome: tolerated well, no immediate complications Procedure, treatment alternatives, risks and benefits explained, specific risks discussed. Consent was given by the patient. Immediately prior to procedure a time out was called to verify the correct patient, procedure, equipment, support staff and site/side marked as required. Patient was prepped and draped in the usual sterile fashion.       Clinical Data: No additional findings.   Subjective: Chief Complaint  Patient presents with  . Right Hip - Follow-up  . Lower Back - Pain  . Left Arm - Pain  The patient is well-known to the office.  She comes in with multiple complaints today.  She had a fall back in December and has had anterior shoulder pain of left shoulder since then.  It hurts with overhead activities and reaching behind her and is been slowly getting worse.  She does report some slight weakness in that shoulder.  She does have a history of bilateral hip replacements.  She reports pain over the lateral aspect of her right hip.  She says both knees have been somewhat sore  to is well but her main complaint also is numbness and tingling going down her right backside from her back all the past her knee into her foot with numbness and tingling.  She is not a diabetic.  HPI  Review of Systems She currently denies any headache, chest pain, shortness of breath, fever, chills, nausea, vomiting.  Objective: Vital Signs: There were no vitals taken for this visit.  Physical Exam She is alert and oriented x3 and in no acute distress Ortho Exam Examination of her left shoulder shows positive Neer Hawkins signs.  She has pain with internal rotation and  adduction.  The rotator cuff itself does not appear to be weak.  Exam showed both hips show full range of motion of both hips with no blocks to rotation.  Her pain is over the trochanteric area on the right side.  She also has a positive straight leg raise on the right side with numbness and tingling going down into her right foot and on the lateral aspect of her right leg.  Both knees have patellofemoral crepitation but good range of motion.  Both knees are ligamentously stable. Specialty Comments:  No specialty comments available.  Imaging: No results found.   PMFS History: Patient Active Problem List   Diagnosis Date Noted  . Trochanteric bursitis, right hip 03/23/2018  . Acute right-sided low back pain with right-sided sciatica 03/23/2018  . Plantar fasciitis 08/03/2017  . 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 Arthritis pain of hip 07/07/2013  . Postoperative anemia due to acute blood loss 05/28/2013    Class: Acute  . Degenerative arthritis of hip 05/26/2013   Past Medical History:  Diagnosis Date  . Arthritis    osteo  . Asthma   . Benign meningioma ([facility]) 05-12-13   dx. '98- small, no problems  . Bronchitis 05-12-13   occ. bouts with bronchitis  . Cancer ([facility]) 02/2010   left breast cancer  . History of cancer chemotherapy   . History of frequent urinary tract infections    last tx. 1 month ago  . PONV (postoperative nausea and vomiting)   . Recurrent sinus infections    last  2 months ago  . Wears partial dentures    partials top and bottom    Family History  Problem Relation Age of Onset  . Colon cancer Mother   . High blood pressure Sister   . High blood pressure Sister     Past Surgical History:  Procedure Laterality Date  . back kyoplasty  2017  . BREAST RECONSTRUCTION WITH PLACEMENT OF TISSUE EXPANDER AND FLEX HD (ACELLULAR HYDRATED DERMIS) Left 12/28/2013   Procedure: BREAST RECONSTRUCTION WITH PLACEMENT OF TISSUE EXPANDER AND FLEX HD TO  LEFT BREAST  (ACELLULAR HYDRATED DERMIS);  Surgeon: Theodoro Kos, DO;  Location: [facility];  Service: Plastics;  Laterality: Left;  . BREAST SURGERY Left 2011   '11- left breast mastectomy  . CHOLECYSTECTOMY    . MASTOPEXY Right 03/15/2014   Procedure: RIGHT BREAST MASTOPEXY;  Surgeon: Theodoro Kos, DO;  Location: [facility];  Service: Plastics;  Laterality: Right;  . port-acath  05-12-13   insertion and removal  . PUNCH BIOPSY OF SKIN Right 03/15/2014   Procedure: PUNCH BIOPSY OF SKIN x2 right chest and midline chest;  Surgeon: Theodoro Kos, DO;  Location: [facility];  Service: Plastics;  Laterality: Right;  . REMOVAL OF BILATERAL TISSUE EXPANDERS WITH PLACEMENT OF BILATERAL BREAST IMPLANTS Bilateral  03/15/2014   Procedure: REMOVAL OF LEFT  TISSUE EXPANDERS WITH PLACEMENT OF BILATERAL BREAST IMPLANTS ;  Surgeon: Theodoro Kos, DO;  Location: [facility];  Service: Plastics;  Laterality: Bilateral;  . SINOSCOPY  04/2017  . TOTAL HIP ARTHROPLASTY Left 05/26/2013   Procedure: Left TOTAL HIP ARTHROPLASTY ANTERIOR APPROACH;  Surgeon: Mcarthur Rossetti, MD;  [facility];  Service: Orthopedics;  Laterality: Left;  . TOTAL HIP ARTHROPLASTY Right 07/07/2013   Procedure: RIGHT TOTAL HIP ARTHROPLASTY ANTERIOR APPROACH;  Surgeon: Mcarthur Rossetti, MD;  [facility];  Service: Orthopedics;  Laterality: Right;  . TUBAL LIGATION     Social History   Occupational History  . Not on file  Tobacco Use  . Smoking status: Former Smoker    Packs/day: 1.00    Years: 20.00    Pack years: 20.00    Types: Cigarettes    Last attempt to quit: 05/12/2010    Years since quitting: 7.8  . Smokeless tobacco: Never Used  Substance and Sexual Activity  . Alcohol use: Yes    Comment: social  . Drug use: No  . Sexual activity: Not Currently

## 2018-03-24 ENCOUNTER — Ambulatory Visit: Payer: Managed Care, Other (non HMO) | Admitting: Allergy and Immunology

## 2018-03-24 ENCOUNTER — Ambulatory Visit (INDEPENDENT_AMBULATORY_CARE_PROVIDER_SITE_OTHER): Payer: Managed Care, Other (non HMO) | Admitting: Orthopaedic Surgery

## 2018-03-25 ENCOUNTER — Encounter: Payer: Self-pay | Admitting: Family Medicine

## 2018-03-25 ENCOUNTER — Ambulatory Visit: Payer: Managed Care, Other (non HMO) | Admitting: Family Medicine

## 2018-03-25 ENCOUNTER — Other Ambulatory Visit (INDEPENDENT_AMBULATORY_CARE_PROVIDER_SITE_OTHER): Payer: Self-pay

## 2018-03-25 VITALS — BP 124/80 | HR 74 | Resp 20

## 2018-03-25 DIAGNOSIS — K219 Gastro-esophageal reflux disease without esophagitis: Secondary | ICD-10-CM | POA: Diagnosis not present

## 2018-03-25 DIAGNOSIS — J3089 Other allergic rhinitis: Secondary | ICD-10-CM | POA: Diagnosis not present

## 2018-03-25 DIAGNOSIS — J4520 Mild intermittent asthma, uncomplicated: Secondary | ICD-10-CM

## 2018-03-25 DIAGNOSIS — J324 Chronic pansinusitis: Secondary | ICD-10-CM

## 2018-03-25 DIAGNOSIS — M4807 Spinal stenosis, lumbosacral region: Secondary | ICD-10-CM

## 2018-03-25 NOTE — Progress Notes (Addendum)
[address] [place] [address] Dept: [phone]  FOLLOW UP NOTE  Patient ID: [name], female    DOB: [birth year]  Age: 62 y.o. MRN: [id number] Date of Office Visit: 03/25/2018  Assessment  Chief Complaint: Asthma and Allergic Rhinitis   HPI [name] is a 62 year old female who presents to the clinic She was last seen in this clinic on 02/17/2018 by Dr. Neldon Mc for evaluation of mild intermittent asthma, GERD, chronic rhinitis, and chronic pansinusitis. At that time, her immune workup was unremarkable and she started on omeprazole and ranitidine for reflux, montelukast, and Nasocort nasal spray.  At today's visit, she reports there has been little change over the last month. She does report a reduction in nasal drainage, however, she continues to experience post nasal drainage, voice hoarseness, constant throat clearing, ear fullness, and some food getting stuck in her esophagus. She denies shortness of breath, cough, and wheezing and she has not needed to use her albuterol inhaler.  She is currently using nasal rinses and Nasocort which has decreased the amount of nasal drainage she is experiencing. She continues montelukast 10 mg, omeprazole 40 mg once a day, and ranitidine 300 mg once a day. She is not currently using ipratropium nasal spray. She also reports that she still has occasional heart burn despite her current reflux treatment.     Drug Allergies:  Allergies  Allergen Reactions  . Codeine Nausea Only  . Meperidine Nausea Only  . Other     "narcotics" causes nausea and vomiting    Physical Exam: BP 124/80   Pulse 74   Resp 20    Physical Exam  Constitutional: She is oriented to person, place, and time. She appears well-developed and well-nourished.  HENT:  Head: Normocephalic and atraumatic.  Right Ear: External ear normal.  Left Ear: External ear normal.  Bilateral nares erythematous and edematous with clear nasal drainage noted. Pharynx with a  cobblestone appearance.   Eyes: Conjunctivae and EOM are normal.  Neck: Normal range of motion. Neck supple.  Cardiovascular: Normal rate, regular rhythm and normal heart sounds.  No murmur noted.  Pulmonary/Chest: Effort normal and breath sounds normal.  Lungs clear to auscultation.  Musculoskeletal: Normal range of motion.  Neurological: She is alert and oriented to person, place, and time.  Skin: Skin is warm and dry.  Psychiatric: She has a normal mood and affect. Her behavior is normal. Judgment and thought content normal.    Diagnostics: FVC 2.96, FEV1 2.24. Predicted FVC 3.21, predicted FEV1 2.49. Spirometry is within normal limits.   Assessment and Plan: 1. Mild intermittent asthma without complication   2. LPRD (laryngopharyngeal reflux disease)   3. Other allergic rhinitis   4. Chronic pansinusitis     Meds ordered this encounter  Medications  . Carbinoxamine Maleate 6 MG TABS    Sig: Take 1 tablet by mouth every 8 (eight) hours as needed.    Dispense:  90 tablet    Refill:  3    Patient Instructions    1.  Treat and prevent inflammation:   A.  OTC Nasacort 1 spray each nostril once a day  B.  Montelukast 10 mg tablet once a day  2. Ipratroprium 0.06% nasal spray 1-2 sprays in each nostril up to every 6 hours to dry nasal secretions.   3.  Treat and prevent reflux:   A.  Consolidate all chocolate and other sources of caffeine consumption  B.  Omeprazole 40 mg tablet in  a.m.  C.  Ranitidine 300 mg tablet in p.m.   4.  If needed:   A.  Nasal saline irrigation  B.  Cabinoxamine 6 mg tablets once every 8 hours  C.  OTC Systane eyedrops multiple times a day  D.  Pro Air HFA 2 puffs every 4-6 hours  E. If no improvement consider referral for endoscopy  5.  Return for follow up in 2 months or sooner if needed    Return in about 2 months (around 05/25/2018), or if symptoms worsen or fail to improve.   Natalyah Continues to experience an inflamed and irritated  respiratory tract. Despite aggressive medical regimen she reports little progress. I have added ipratropium nasal spray and carbinoxamine to her daily regimen. If she continues to experience respiratory tract inflammation as well as food lodging in her esophagus, a referral for endoscopy may be warranted.  I will see her in this office in 2 months for further evaluation.   Thank you for the opportunity to care for this patient.  Please do not hesitate to contact me with questions.  Gareth Morgan, FNP [facility]  I have provided oversight concerning Gareth Morgan' evaluation and treatment of this patient's health issues addressed during today's encounter. I agree with the assessment and therapeutic plan as outlined in the note.   Signed,   Jiles Prows, MD,  Allergy and Immunology,  [facility].

## 2018-03-25 NOTE — Patient Instructions (Addendum)
   1.  Treat and prevent inflammation:   A.  OTC Nasacort 1 spray each nostril once a day  B.  Montelukast 10 mg tablet once a day 2. Ipratroprium 0.06% nasal spray 1-2 sprays in each nostril up to every 6 hours to dry nasal secretions.   3.  Treat and prevent reflux:   A.  Consolidate all chocolate and other sources of caffeine consumption  B.  Omeprazole 40 mg tablet in a.m.  C.  Ranitidine 300 mg tablet in p.m.   4.  If needed:   A.  Nasal saline irrigation  B.  Cabinoxamine 6 mg tablets once every 8 hours  C.  OTC Systane eyedrops multiple times a day  D.  Pro Air HFA 2 puffs every 4-6 hours  E. If no improvement consider referral for endoscopy  5.  Return for follow up in 2 months or sooner if needed

## 2018-03-26 MED ORDER — CARBINOXAMINE MALEATE 6 MG PO TABS: 1.0000 | ORAL_TABLET | Freq: Three times a day (TID) | ORAL | 3 refills | Status: AC | PRN

## 2018-03-27 ENCOUNTER — Encounter: Payer: Self-pay | Admitting: Family Medicine

## 2018-03-27 DIAGNOSIS — K219 Gastro-esophageal reflux disease without esophagitis: Secondary | ICD-10-CM | POA: Insufficient documentation

## 2018-03-27 DIAGNOSIS — J324 Chronic pansinusitis: Secondary | ICD-10-CM | POA: Insufficient documentation

## 2018-03-27 DIAGNOSIS — J4520 Mild intermittent asthma, uncomplicated: Secondary | ICD-10-CM | POA: Insufficient documentation

## 2018-03-27 DIAGNOSIS — J3089 Other allergic rhinitis: Secondary | ICD-10-CM | POA: Insufficient documentation

## 2018-03-27 DIAGNOSIS — J45909 Unspecified asthma, uncomplicated: Secondary | ICD-10-CM | POA: Insufficient documentation

## 2018-03-31 ENCOUNTER — Ambulatory Visit (INDEPENDENT_AMBULATORY_CARE_PROVIDER_SITE_OTHER): Payer: Managed Care, Other (non HMO) | Admitting: Orthopaedic Surgery

## 2018-04-01 ENCOUNTER — Telehealth (INDEPENDENT_AMBULATORY_CARE_PROVIDER_SITE_OTHER): Payer: Self-pay | Admitting: Orthopaedic Surgery

## 2018-04-01 NOTE — Telephone Encounter (Signed)
Sounds like we need to do some conservative treatment first.  I would not do a peer-to-peer review because they will still deny it since it is not emergent.  She will need to try some PT first.

## 2018-04-01 NOTE — Telephone Encounter (Signed)
IC and s/w patient, explained the reason for denial was no 6 week period of conservative treatment monitored by physician.  Do you want to try a peer to peer?  Or do you want to f/u with her in mid-[place] and try for MRI again?

## 2018-04-01 NOTE — Telephone Encounter (Signed)
Patient called stating that the insurance denied her MRI.  She was told that Dr. Ninfa Linden would have to call the insurance company and explain why he feel that she needs the MRI.  CB#[phone].  Thank you.

## 2018-04-04 NOTE — Telephone Encounter (Signed)
Patient called and wanted to know if she still needed to see Dr. Ninfa Linden on Thursday.  She also stated that the injection did not help her hip.

## 2018-04-05 ENCOUNTER — Other Ambulatory Visit: Payer: Managed Care, Other (non HMO)

## 2018-04-05 NOTE — Telephone Encounter (Signed)
OK.  Please set up PT for her lower back and due to sciatica.  Any modalities.  Therapist discretion.

## 2018-04-05 NOTE — Telephone Encounter (Signed)
Ok she would like to go to [facility]

## 2018-04-05 NOTE — Telephone Encounter (Signed)
No, she can cancel We need to start her on physical therapy so we can re-order this MRI in a few weeks Can you ask her if there is a certain therapist she would like to go to?

## 2018-04-05 NOTE — Telephone Encounter (Signed)
IC the patient and she stated that she preferred [facility] [address].  Thank you.

## 2018-04-05 NOTE — Telephone Encounter (Signed)
Faxed order to [facility]

## 2018-04-06 ENCOUNTER — Other Ambulatory Visit: Payer: Managed Care, Other (non HMO)

## 2018-04-07 ENCOUNTER — Ambulatory Visit (INDEPENDENT_AMBULATORY_CARE_PROVIDER_SITE_OTHER): Payer: Managed Care, Other (non HMO) | Admitting: Orthopaedic Surgery

## 2018-04-13 ENCOUNTER — Telehealth (INDEPENDENT_AMBULATORY_CARE_PROVIDER_SITE_OTHER): Payer: Self-pay | Admitting: *Deleted

## 2018-04-13 NOTE — Telephone Encounter (Signed)
Pt called stating Dr. Ninfa Linden ordered MRI of lumbar spine and her insurance denied it d/t no conservative treatment in last 6 weeks and he ordered Physical Therapy pt states that she can not afford Physical Therapy which will cost her 60 dollars per visit and wants to know if their were other options instead. I advised her we can do xray of her lumbar spine since that was one of the reasons for denial and no xray was done when in office. She agreed on the xray but then stated she was also hurting her hips and csp and tsp and if she was coming to have the lumbar spine done if I could also do the others. I advised pt since she is having issues with all this that maybe she should come back in for a follow up visit so she can discuss with the provider. She agrees and is scheduled.

## 2018-04-21 ENCOUNTER — Encounter (INDEPENDENT_AMBULATORY_CARE_PROVIDER_SITE_OTHER): Payer: Self-pay | Admitting: Orthopaedic Surgery

## 2018-04-21 ENCOUNTER — Ambulatory Visit (INDEPENDENT_AMBULATORY_CARE_PROVIDER_SITE_OTHER): Payer: Managed Care, Other (non HMO)

## 2018-04-21 ENCOUNTER — Ambulatory Visit (INDEPENDENT_AMBULATORY_CARE_PROVIDER_SITE_OTHER): Payer: Managed Care, Other (non HMO) | Admitting: Orthopaedic Surgery

## 2018-04-21 DIAGNOSIS — M545 Low back pain: Secondary | ICD-10-CM

## 2018-04-21 DIAGNOSIS — G8929 Other chronic pain: Secondary | ICD-10-CM | POA: Diagnosis not present

## 2018-04-21 DIAGNOSIS — M79671 Pain in right foot: Secondary | ICD-10-CM

## 2018-04-21 NOTE — Progress Notes (Signed)
Office Visit Note   Patient: [name]           Date of Birth: [birth year]           MRN: [id number] Visit Date: 04/21/2018              Requested by: Imagene Riches, NP [place], [place] [address] PCP: Imagene Riches, NP   Assessment & Plan: Visit Diagnoses:  1. Pain in right foot   2. Chronic bilateral low back pain without sciatica     Plan: At this point an MRI of the lumbar spine is medically warranted.  She is now tried and failed all conservative treatment measures and her symptoms are worsening in spite of this treatment regimen.  Her plain film findings show significant disease in her lumbar spine and this correlates with her clinical exam findings as well.  Given the fact that she is failed all conservative treatment measures an MRI is warranted to help [place] assess better the pathology of her spine to get an idea of what is causing severe right-sided radicular symptoms.  We will see her back after this MRI.  Follow-Up Instructions: Return in about 2 weeks (around 05/05/2018).   Orders:  Orders Placed This Encounter  Procedures  . XR Foot Complete Right  . XR Lumbar Spine 2-3 Views   No orders of the defined types were placed in this encounter.     Procedures: No procedures performed   Clinical Data: No additional findings.   Subjective: Chief Complaint  Patient presents with  . Lower Back - Pain  . Right Foot - Pain  The patient is well-known to me.  She comes in with continued low back pain with right-sided radicular symptoms.  She still has right-sided sciatica and it radiates down behind her knee into her foot with numbness and tingling.  She is not a diabetic.  At this point this is been going on for a significant period of time.  She does have a history of an L1 compression fracture and has had a kyphoplasty at L1.  We have tried activity modification, anti-inflammatories, rest, ice, heat and even back extension exercises with physical therapy.   She continues to have the right-sided radicular symptoms and low back pain in spite of our conservative treatment measures.  HPI  Review of Systems She is alert and oriented x3 and in no acute distress She currently denies any headache, chest pain, shortness of breath, fever, chills, nausea, vomiting.   Objective: Vital Signs: There were no vitals taken for this visit.  Physical Exam  Ortho Exam Examination of her lumbar spine shows pain in the lower aspect of the lumbar spine radiating to the right side.  She has facet mediated pain as well as paraspinal pain to the right side.  She has pain with static stretch in the sciatic region.  She has a positive straight leg raise to the right side.  She has subjective numbness in the L4 and L5 distributions as well as S1.  There is some slight weakness with her foot dorsiflexion. Specialty Comments:  No specialty comments available.  Imaging: Xr Foot Complete Right  Result Date: 04/21/2018 3 views of the right foot show no fractures or acute findings and normal alignment of all the bones.  Xr Lumbar Spine 2-3 Views  Result Date: 04/21/2018 2 views of lumbar spine show a significant lumbar scoliosis.  There is severe degenerative disc disease at L4-L5 and L5-S1.  There is evidence of a previous kyphoplasty at L1 due to compression deformity.  There is significant spondylosis throughout the lumbar spine.    PMFS History: Patient Active Problem List   Diagnosis Date Noted  . Mild intermittent asthma without complication 16/09/9603  . Asthma, well controlled 03/27/2018  . LPRD (laryngopharyngeal reflux disease) 03/27/2018  . Other allergic rhinitis 03/27/2018  . Chronic pansinusitis 03/27/2018  . Trochanteric bursitis, right hip 03/23/2018  . Acute right-sided low back pain with right-sided sciatica 03/23/2018  . Plantar fasciitis 08/03/2017  . 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 Arthritis pain of hip 07/07/2013  .  Postoperative anemia due to acute blood loss 05/28/2013    Class: Acute  . Degenerative arthritis of hip 05/26/2013   Past Medical History:  Diagnosis Date  . Arthritis    osteo  . Asthma   . Benign meningioma ([facility]) 05-12-13   dx. '98- small, no problems  . Bronchitis 05-12-13   occ. bouts with bronchitis  . Cancer ([facility]) 02/2010   left breast cancer  . History of cancer chemotherapy   . History of frequent urinary tract infections    last tx. 1 month ago  . PONV (postoperative nausea and vomiting)   . Recurrent sinus infections    last  2 months ago  . Wears partial dentures    partials top and bottom    Family History  Problem Relation Age of Onset  . Colon cancer Mother   . High blood pressure Sister   . High blood pressure Sister     Past Surgical History:  Procedure Laterality Date  . back kyoplasty  2017  . BREAST RECONSTRUCTION WITH PLACEMENT OF TISSUE EXPANDER AND FLEX HD (ACELLULAR HYDRATED DERMIS) Left 12/28/2013   Procedure: BREAST RECONSTRUCTION WITH PLACEMENT OF TISSUE EXPANDER AND FLEX HD TO LEFT BREAST  (ACELLULAR HYDRATED DERMIS);  Surgeon: Theodoro Kos, DO;  Location: [facility];  Service: Plastics;  Laterality: Left;  . BREAST SURGERY Left 2011   '11- left breast mastectomy  . CHOLECYSTECTOMY    . MASTOPEXY Right 03/15/2014   Procedure: RIGHT BREAST MASTOPEXY;  Surgeon: Theodoro Kos, DO;  Location: [facility];  Service: Plastics;  Laterality: Right;  . port-acath  05-12-13   insertion and removal  . PUNCH BIOPSY OF SKIN Right 03/15/2014   Procedure: PUNCH BIOPSY OF SKIN x2 right chest and midline chest;  Surgeon: Theodoro Kos, DO;  Location: [facility];  Service: Plastics;  Laterality: Right;  . REMOVAL OF BILATERAL TISSUE EXPANDERS WITH PLACEMENT OF BILATERAL BREAST IMPLANTS Bilateral 03/15/2014   Procedure: REMOVAL OF LEFT  TISSUE EXPANDERS WITH PLACEMENT OF BILATERAL BREAST IMPLANTS ;  Surgeon: Theodoro Kos, DO;   Location: [facility];  Service: Plastics;  Laterality: Bilateral;  . SINOSCOPY  04/2017  . TOTAL HIP ARTHROPLASTY Left 05/26/2013   Procedure: Left TOTAL HIP ARTHROPLASTY ANTERIOR APPROACH;  Surgeon: Mcarthur Rossetti, MD;  [facility];  Service: Orthopedics;  Laterality: Left;  . TOTAL HIP ARTHROPLASTY Right 07/07/2013   Procedure: RIGHT TOTAL HIP ARTHROPLASTY ANTERIOR APPROACH;  Surgeon: Mcarthur Rossetti, MD;  [facility];  Service: Orthopedics;  Laterality: Right;  . TUBAL LIGATION     Social History   Occupational History  . Not on file  Tobacco Use  . Smoking status: Former Smoker    Packs/day: 1.00    Years: 20.00    Pack years: 20.00    Types: Cigarettes  Last attempt to quit: 05/12/2010    Years since quitting: 7.9  . Smokeless tobacco: Never Used  Substance and Sexual Activity  . Alcohol use: Yes    Comment: social  . Drug use: No  . Sexual activity: Not Currently

## 2018-04-22 ENCOUNTER — Other Ambulatory Visit (INDEPENDENT_AMBULATORY_CARE_PROVIDER_SITE_OTHER): Payer: Self-pay

## 2018-04-22 DIAGNOSIS — M4807 Spinal stenosis, lumbosacral region: Secondary | ICD-10-CM

## 2018-04-30 ENCOUNTER — Inpatient Hospital Stay: Admission: RE | Admit: 2018-04-30 | Payer: Managed Care, Other (non HMO) | Source: Ambulatory Visit

## 2018-05-05 ENCOUNTER — Ambulatory Visit (INDEPENDENT_AMBULATORY_CARE_PROVIDER_SITE_OTHER): Payer: Managed Care, Other (non HMO) | Admitting: Orthopaedic Surgery

## 2018-05-23 ENCOUNTER — Telehealth: Payer: Self-pay | Admitting: Allergy and Immunology

## 2018-05-23 NOTE — Telephone Encounter (Signed)
Patient advised to contact pharmacy for refills of medications

## 2018-05-23 NOTE — Telephone Encounter (Signed)
[name] called in and stated she stopped taking OMEPRAZOLE and RANITIDINE because she felt it wasn't "doing me any good"  [name] called in and stated now she is having problems with acid reflux and would like to start the medications again.  Please advise.

## 2018-06-07 ENCOUNTER — Telehealth (INDEPENDENT_AMBULATORY_CARE_PROVIDER_SITE_OTHER): Payer: Self-pay | Admitting: Orthopaedic Surgery

## 2018-06-07 NOTE — Telephone Encounter (Signed)
Patient called advised she would like her MRI to be scheduled at [facility] [address] [facility] in [place]. The number to contact patient is [phone]

## 2018-06-10 NOTE — Telephone Encounter (Signed)
Facility has been changed and appt made and pt aware

## 2018-06-30 ENCOUNTER — Encounter (INDEPENDENT_AMBULATORY_CARE_PROVIDER_SITE_OTHER): Payer: Self-pay | Admitting: Orthopaedic Surgery

## 2018-06-30 ENCOUNTER — Ambulatory Visit (INDEPENDENT_AMBULATORY_CARE_PROVIDER_SITE_OTHER): Payer: Managed Care, Other (non HMO) | Admitting: Orthopaedic Surgery

## 2018-06-30 DIAGNOSIS — M4807 Spinal stenosis, lumbosacral region: Secondary | ICD-10-CM

## 2018-06-30 DIAGNOSIS — G8929 Other chronic pain: Secondary | ICD-10-CM | POA: Diagnosis not present

## 2018-06-30 DIAGNOSIS — M545 Low back pain: Secondary | ICD-10-CM

## 2018-06-30 NOTE — Progress Notes (Signed)
The patient comes in today to go over an MRI of her lumbar spine.  She been having worsening low back pain with episodes of her back "giving out".  She is had numbness and tingling going down her right leg into her right foot.  This is been slowly getting worse for her.  She does go to a gym and is worked on [employer], [employer] exercises.  She is been with physical therapy as well.  She is never had any other type of intervention for her lumbar spine.  She has had a history of chronic T12 and L1 compression fractures.  She denies any weakness in her legs.  On exam she does have a positive straight leg raise on the right side but not on the left.  She has subjective numbness in her right foot but it is mild and seems to be in an L5 distribution.  She has excellent strength in both legs.  She has pain with flexion extension of the lumbar spine but there is moderate to mild.  I do not have the MRI disc with me but we do have MRI report and it shows her chronic T12 and L1 compression fractures.  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  She also has moderate facet disease at L4-L5.  At this point I have recommended at least considering an intervention by Dr. Ernestina Patches with an injection at L5-S1 or potentially eventually facet joint injections.  She will take this in consideration and call us if she decides to have this done.

## 2018-09-20 ENCOUNTER — Ambulatory Visit: Payer: Managed Care, Other (non HMO) | Admitting: Podiatry

## 2018-09-20 ENCOUNTER — Other Ambulatory Visit: Payer: Self-pay

## 2018-09-20 ENCOUNTER — Encounter: Payer: Self-pay | Admitting: Podiatry

## 2018-09-20 ENCOUNTER — Ambulatory Visit (INDEPENDENT_AMBULATORY_CARE_PROVIDER_SITE_OTHER): Payer: Managed Care, Other (non HMO)

## 2018-09-20 VITALS — BP 115/65 | HR 86 | Resp 16 | Ht 65.0 in | Wt 140.0 lb

## 2018-09-20 DIAGNOSIS — M7731 Calcaneal spur, right foot: Secondary | ICD-10-CM | POA: Diagnosis not present

## 2018-09-20 DIAGNOSIS — M79671 Pain in right foot: Secondary | ICD-10-CM

## 2018-09-20 DIAGNOSIS — M7732 Calcaneal spur, left foot: Secondary | ICD-10-CM

## 2018-09-20 DIAGNOSIS — M216X2 Other acquired deformities of left foot: Secondary | ICD-10-CM

## 2018-09-20 DIAGNOSIS — M722 Plantar fascial fibromatosis: Secondary | ICD-10-CM | POA: Diagnosis not present

## 2018-09-20 DIAGNOSIS — M216X1 Other acquired deformities of right foot: Secondary | ICD-10-CM

## 2018-09-20 DIAGNOSIS — M79672 Pain in left foot: Secondary | ICD-10-CM

## 2018-09-20 DIAGNOSIS — B351 Tinea unguium: Secondary | ICD-10-CM

## 2018-09-20 MED ORDER — CICLOPIROX 8 % EX SOLN: Freq: Every day | CUTANEOUS | 0 refills | Status: AC

## 2018-09-20 NOTE — Progress Notes (Signed)
Subjective:  Patient ID: [name], female    DOB: 1956-12-09,  MRN: [id number]  Chief Complaint  Patient presents with  . Foot Pain    BL bottom heels x 2 mo; 8/10 sharp stabbing constant pain -worst in AM and resting Tx: nonw  . Nail Problem    BL hallux discoloratin x 1 year -no injury tx: "my dermatologist gave me a rx it helped but it came back."    62 y.o. female presents with the above complaint.  Reports pain to the bottom of both heels worse in the a.m.  Left hurts more than the right.  Hurting for the past 2 months.  Reports a 10 sharp stabbing pain that hurts constantly.  Hurts after period of rest.  Denies injury.  Denies prior treatments.  Complains of thickening discoloration of both great toes.  Endorses frequent pedicures.  Present for about a year.  Denies injury.  States that the dermatologist gave her something a while back that helped but it came back.  Did not notice discoloration to start getting pedicures.  Review of Systems  Musculoskeletal: Positive for arthralgias and myalgias.  All other systems reviewed and are negative.   Past Medical History:  Diagnosis Date  . Arthritis    osteo  . Asthma   . Benign meningioma ([place]) 05-12-13   dx. '98- small, no problems  . Bronchitis 05-12-13   occ. bouts with bronchitis  . Cancer ([place]) 02/2010   left breast cancer  . History of cancer chemotherapy   . History of frequent urinary tract infections    last tx. 1 month ago  . PONV (postoperative nausea and vomiting)   . Recurrent sinus infections    last  2 months ago  . Wears partial dentures    partials top and bottom    Current Outpatient Medications:  .  alendronate (FOSAMAX) 70 MG tablet, Take 70 mg by mouth every Sunday. Take with a full glass of water on an empty stomach., Disp: , Rfl:  .  albuterol (PROAIR HFA) 108 (90 Base) MCG/ACT inhaler, Inhale into the lungs., Disp: , Rfl:  .  Calcium Carbonate-Vitamin D (CALCIUM-D) 600-400 MG-UNIT TABS, Take 1  tablet by mouth 2 (two) times daily., Disp: , Rfl:  .  Carbinoxamine Maleate 6 MG TABS, Take 1 tablet by mouth every 8 (eight) hours as needed., Disp: 90 tablet, Rfl: 3 .  ciclopirox (PENLAC) 8 % solution, Apply topically at bedtime. Apply over nail and surrounding skin. Apply daily over previous coat. Remove weekly with file or polish remover., Disp: 6.6 mL, Rfl: 0 .  gabapentin (NEURONTIN) 300 MG capsule, Take by mouth., Disp: , Rfl:  .  ipratropium (ATROVENT) 0.06 % nasal spray, Place 2 sprays into both nostrils every 6 (six) hours as needed for rhinitis., Disp: 15 mL, Rfl: 5 .  meclizine (ANTIVERT) 25 MG tablet, Take by mouth., Disp: , Rfl:  .  Melatonin 10 MG TABS, Take 2 tablets by mouth at bedtime., Disp: , Rfl:  .  montelukast (SINGULAIR) 10 MG tablet, Take 1 tablet (10 mg total) by mouth at bedtime., Disp: 30 tablet, Rfl: 5 .  Multiple Vitamin (MULTIVITAMIN WITH MINERALS) TABS, Take 1 tablet by mouth daily., Disp: , Rfl:  .  Omega-3 Fatty Acids (FISH OIL) 500 MG CAPS, Take 1 capsule by mouth daily., Disp: , Rfl:  .  omeprazole (PRILOSEC) 40 MG capsule, Take 1 capsule (40 mg total) by mouth daily., Disp: 30 capsule, Rfl: 5 .  Polyethyl Glycol-Propyl Glycol (SYSTANE OP), Apply to eye., Disp: , Rfl:  .  predniSONE (STERAPRED UNI-PAK 21 TAB) 10 MG (21) TBPK tablet, 12 day dose pack follow directions on pack, Disp: , Rfl:  .  ranitidine (ZANTAC) 300 MG tablet, Take 1 tablet (300 mg total) by mouth at bedtime., Disp: 30 tablet, Rfl: 5 .  terbinafine (LAMISIL) 250 MG tablet, , Disp: , Rfl:  .  triamcinolone (NASACORT ALLERGY 24HR) 55 MCG/ACT AERO nasal inhaler, Place 1 spray into the nose daily., Disp: , Rfl:  .  Turmeric Curcumin 500 MG CAPS, Take 1 tablet by mouth daily., Disp: , Rfl:  .  vitamin C (ASCORBIC ACID) 500 MG tablet, Take 500 mg by mouth daily., Disp: , Rfl:  .  Vitamins/Minerals TABS, Take by mouth., Disp: , Rfl:   Social History   Tobacco Use  Smoking Status Former Smoker  .  Packs/day: 1.00  . Years: 20.00  . Pack years: 20.00  . Types: Cigarettes  . Last attempt to quit: 05/12/2010  . Years since quitting: 8.3  Smokeless Tobacco Never Used    Allergies  Allergen Reactions  . Codeine Nausea Only  . Meperidine Nausea Only  . Other     "narcotics" causes nausea and vomiting   Objective:   Vitals:   09/20/18 1429  BP: 115/65  Pulse: 86  Resp: 16   Body mass index is 23.3 kg/m. Constitutional Well developed. Well nourished.  Vascular Dorsalis pedis pulses palpable bilaterally. Posterior tibial pulses palpable bilaterally. Capillary refill normal to all digits.  No cyanosis or clubbing noted. Pedal hair growth normal.  Neurologic Normal speech. Oriented to person, place, and time. Epicritic sensation to light touch grossly present bilaterally.  Dermatologic Nails well groomed and normal in appearance. No open wounds. No skin lesions.  Orthopedic: Normal joint ROM without pain or crepitus bilaterally. No visible deformities. Tender to palpation at the calcaneal tuber bilaterally. No pain with calcaneal squeeze bilaterally. Ankle ROM diminished range of motion bilaterally. Silfverskiold Test: positive bilaterally.   Radiographs: Taken and reviewed. No acute fractures or dislocations. No evidence of stress fracture.  Plantar heel spur present. Posterior heel spur absent.  Assessment:   1. Plantar fasciitis   2. Heel spur, left   3. Heel spur, right   4. Pain of both heels   5. Acquired equinus deformity of both feet   6. Onychomycosis    Plan:  Patient was evaluated and treated and all questions answered.  Plantar Fasciitis, bilaterally - XR reviewed as above.  - Educated on icing and stretching. Instructions given.  - Injection delivered to the plantar fascia as below. - DME: Night splint dispensed.  Procedure: Injection Tendon/Ligament Location: Bilateral plantar fascia at the glabrous junction; medial approach. Skin Prep:  alcohol Injectate: 1 cc 0.5% marcaine plain, 1 cc dexamethasone phosphate, 0.5 cc kenalog 10. Disposition: Patient tolerated procedure well. Injection site dressed with a band-aid.  Onychomycosis -Educated on etiology of nail fungus. -Rx Penlac. Educated on use.   Return in about 3 weeks (around 10/11/2018) for Plantar fasciitis.

## 2018-09-20 NOTE — Patient Instructions (Signed)

## 2018-09-21 ENCOUNTER — Ambulatory Visit: Payer: Managed Care, Other (non HMO) | Admitting: Sports Medicine

## 2018-09-21 ENCOUNTER — Other Ambulatory Visit: Payer: Self-pay | Admitting: Podiatry

## 2018-09-21 DIAGNOSIS — M7731 Calcaneal spur, right foot: Secondary | ICD-10-CM

## 2018-09-21 DIAGNOSIS — M216X1 Other acquired deformities of right foot: Secondary | ICD-10-CM

## 2018-09-21 DIAGNOSIS — M7732 Calcaneal spur, left foot: Secondary | ICD-10-CM

## 2018-09-21 DIAGNOSIS — M722 Plantar fascial fibromatosis: Secondary | ICD-10-CM

## 2018-09-21 DIAGNOSIS — M216X2 Other acquired deformities of left foot: Secondary | ICD-10-CM

## 2018-09-23 ENCOUNTER — Ambulatory Visit: Payer: Managed Care, Other (non HMO) | Admitting: Sports Medicine

## 2018-09-23 DIAGNOSIS — Z79899 Other long term (current) drug therapy: Secondary | ICD-10-CM | POA: Diagnosis not present

## 2018-09-23 DIAGNOSIS — M810 Age-related osteoporosis without current pathological fracture: Secondary | ICD-10-CM

## 2018-09-23 DIAGNOSIS — Z9012 Acquired absence of left breast and nipple: Secondary | ICD-10-CM | POA: Diagnosis not present

## 2018-09-23 DIAGNOSIS — Z853 Personal history of malignant neoplasm of breast: Secondary | ICD-10-CM

## 2018-09-26 ENCOUNTER — Telehealth: Payer: Self-pay | Admitting: Podiatry

## 2018-09-26 NOTE — Telephone Encounter (Signed)
[name] - do you mind if we get this patient an appointment on your schedule to come in for a plantar fascial brace?

## 2018-09-26 NOTE — Telephone Encounter (Signed)
She can come pick it up, she doesn't need an appt

## 2018-10-11 ENCOUNTER — Ambulatory Visit: Payer: Managed Care, Other (non HMO) | Admitting: Podiatry

## 2018-10-18 ENCOUNTER — Ambulatory Visit: Payer: Managed Care, Other (non HMO) | Admitting: Podiatry

## 2018-10-18 ENCOUNTER — Ambulatory Visit (INDEPENDENT_AMBULATORY_CARE_PROVIDER_SITE_OTHER): Payer: Managed Care, Other (non HMO)

## 2018-10-18 DIAGNOSIS — L600 Ingrowing nail: Secondary | ICD-10-CM

## 2018-10-18 DIAGNOSIS — M79671 Pain in right foot: Secondary | ICD-10-CM

## 2018-10-18 DIAGNOSIS — M722 Plantar fascial fibromatosis: Secondary | ICD-10-CM

## 2018-10-18 DIAGNOSIS — S9032XA Contusion of left foot, initial encounter: Secondary | ICD-10-CM

## 2018-10-18 DIAGNOSIS — M79672 Pain in left foot: Secondary | ICD-10-CM

## 2018-10-18 DIAGNOSIS — M79676 Pain in unspecified toe(s): Secondary | ICD-10-CM | POA: Diagnosis not present

## 2018-10-18 MED ORDER — MELOXICAM 15 MG PO TABS: 15.0000 mg | ORAL_TABLET | Freq: Every day | ORAL | 0 refills | Status: AC

## 2018-10-18 MED ORDER — NEOMYCIN-POLYMYXIN-HC 3.5-10000-1 OT SOLN: OTIC | 0 refills | Status: DC

## 2018-10-18 NOTE — Patient Instructions (Signed)

## 2018-10-18 NOTE — Progress Notes (Signed)
Subjective:  Patient ID: [name], female    DOB: 1956-12-17,  MRN: [id number]  No chief complaint on file.  62 y.o. female presents with f/u left heel pain bilat. Left still hurting a lot, 9/10. Right not hurting as bad.  New complaint of ingrown nails to both great toenails  Additional complaint of pain and bruising, swelling to the left foot on top. Unsure of injury.  Review of Systems  Musculoskeletal: Positive for arthralgias and myalgias.  All other systems reviewed and are negative.   Past Medical History:  Diagnosis Date  . Arthritis    osteo  . Asthma   . Benign meningioma ([place]) 05-12-13   dx. '98- small, no problems  . Bronchitis 05-12-13   occ. bouts with bronchitis  . Cancer ([place]) 02/2010   left breast cancer  . History of cancer chemotherapy   . History of frequent urinary tract infections    last tx. 1 month ago  . PONV (postoperative nausea and vomiting)   . Recurrent sinus infections    last  2 months ago  . Wears partial dentures    partials top and bottom    Current Outpatient Medications:  .  albuterol (PROAIR HFA) 108 (90 Base) MCG/ACT inhaler, Inhale into the lungs., Disp: , Rfl:  .  alendronate (FOSAMAX) 70 MG tablet, Take 70 mg by mouth every Sunday. Take with a full glass of water on an empty stomach., Disp: , Rfl:  .  Calcium Carbonate-Vitamin D (CALCIUM-D) 600-400 MG-UNIT TABS, Take 1 tablet by mouth 2 (two) times daily., Disp: , Rfl:  .  Carbinoxamine Maleate 6 MG TABS, Take 1 tablet by mouth every 8 (eight) hours as needed., Disp: 90 tablet, Rfl: 3 .  ciclopirox (PENLAC) 8 % solution, Apply topically at bedtime. Apply over nail and surrounding skin. Apply daily over previous coat. Remove weekly with file or polish remover., Disp: 6.6 mL, Rfl: 0 .  gabapentin (NEURONTIN) 300 MG capsule, Take by mouth., Disp: , Rfl:  .  ipratropium (ATROVENT) 0.06 % nasal spray, Place 2 sprays into both nostrils every 6 (six) hours as needed for rhinitis., Disp:  15 mL, Rfl: 5 .  meclizine (ANTIVERT) 25 MG tablet, Take by mouth., Disp: , Rfl:  .  Melatonin 10 MG TABS, Take 2 tablets by mouth at bedtime., Disp: , Rfl:  .  meloxicam (MOBIC) 15 MG tablet, Take 1 tablet (15 mg total) by mouth daily., Disp: 30 tablet, Rfl: 0 .  montelukast (SINGULAIR) 10 MG tablet, Take 1 tablet (10 mg total) by mouth at bedtime., Disp: 30 tablet, Rfl: 5 .  Multiple Vitamin (MULTIVITAMIN WITH MINERALS) TABS, Take 1 tablet by mouth daily., Disp: , Rfl:  .  neomycin-polymyxin-hydrocortisone (CORTISPORIN) OTIC solution, Apply 2-3 drops to the ingrown toenail site twice daily. Cover with band-aid., Disp: 10 mL, Rfl: 0 .  Omega-3 Fatty Acids (FISH OIL) 500 MG CAPS, Take 1 capsule by mouth daily., Disp: , Rfl:  .  omeprazole (PRILOSEC) 40 MG capsule, Take 1 capsule (40 mg total) by mouth daily., Disp: 30 capsule, Rfl: 5 .  Polyethyl Glycol-Propyl Glycol (SYSTANE OP), Apply to eye., Disp: , Rfl:  .  predniSONE (STERAPRED UNI-PAK 21 TAB) 10 MG (21) TBPK tablet, 12 day dose pack follow directions on pack, Disp: , Rfl:  .  ranitidine (ZANTAC) 300 MG tablet, Take 1 tablet (300 mg total) by mouth at bedtime., Disp: 30 tablet, Rfl: 5 .  terbinafine (LAMISIL) 250 MG tablet, , Disp: , Rfl:  .  triamcinolone (NASACORT ALLERGY 24HR) 55 MCG/ACT AERO nasal inhaler, Place 1 spray into the nose daily., Disp: , Rfl:  .  Turmeric Curcumin 500 MG CAPS, Take 1 tablet by mouth daily., Disp: , Rfl:  .  vitamin C (ASCORBIC ACID) 500 MG tablet, Take 500 mg by mouth daily., Disp: , Rfl:  .  Vitamins/Minerals TABS, Take by mouth., Disp: , Rfl:   Social History   Tobacco Use  Smoking Status Former Smoker  . Packs/day: 1.00  . Years: 20.00  . Pack years: 20.00  . Types: Cigarettes  . Last attempt to quit: 05/12/2010  . Years since quitting: 8.4  Smokeless Tobacco Never Used    Allergies  Allergen Reactions  . Codeine Nausea Only  . Meperidine Nausea Only  . Other     "narcotics" causes nausea  and vomiting   Objective:   There were no vitals filed for this visit. There is no height or weight on file to calculate BMI. Constitutional Well developed. Well nourished.  Vascular Dorsalis pedis pulses palpable bilaterally. Posterior tibial pulses palpable bilaterally. Capillary refill normal to all digits.  No cyanosis or clubbing noted. Pedal hair growth normal.  Neurologic Normal speech. Oriented to person, place, and time. Epicritic sensation to light touch grossly present bilaterally.  Dermatologic Ingrowing nails L medial border, right lateral border hallux nails. No open wounds. No skin lesions.  Orthopedic: Normal joint ROM without pain or crepitus bilaterally. No visible deformities. Tender to palpation at the calcaneal tuber bilaterally. No pain with calcaneal squeeze bilaterally. Ankle ROM diminished range of motion bilaterally. Silfverskiold Test: positive bilaterally. POP dorsal lateral foot with contusion noted.   Radiographs: Taken and reviewed no interval changes no fractures or dislocations.  10/1: No acute fractures or dislocations. No evidence of stress fracture.  Plantar heel spur present. Posterior heel spur absent.  Assessment:   1. Contusion of left foot, initial encounter   2. Ingrown nail   3. Pain around toenail   4. Plantar fasciitis   5. Pain of left heel   6. Pain of right heel    Plan:  Patient was evaluated and treated and all questions answered.  Plantar Fasciitis, left - Injection #2 delivered to the left plantar fascia as below.   Procedure: Injection Tendon/Ligament Consent: Verbal consent obtained. Location: Left plantar fascia at the glabrous junction; medial approach. Skin Prep: Alcohol. Injectate: 1 cc 0.5% marcaine plain, 1 cc dexamethasone phosphate, 0.5 cc kenalog 40. Disposition: Patient tolerated procedure well. Injection site dressed with a band-aid.  Plantar Fasciitis, right -No injection right (only one injection  given right) -Continue use of plantar fascial brace -Rx Meloxicam  Ingrown Nail, bilaterally -Patient elects to proceed with ingrown toenail removal today -Ingrown nail excised. See procedure note. -Educated on post-procedure care including soaking. Written instructions provided. -Patient to follow up in 2 weeks for nail check.  Procedure: Excision of Ingrown Toenail Location: Left 1st toe medial nail borders, right great toe lateral Anesthesia: Lidocaine 1% plain; 1.60mL and Marcaine 0.5% plain; 1.65mL, digital block. Skin Prep: Alcohol. Dressing: Silvadene; telfa; dry, sterile, compression dressing. Technique: Following skin prep, the toe was exsanguinated and a tourniquet was secured at the base of the toe. The affected nail border was freed, split with a nail splitter, and excised. Chemical matrixectomy was then performed with phenol and irrigated out with alcohol. The tourniquet was then removed and sterile dressing applied. Disposition: Patient tolerated procedure well. Patient to return in 2 weeks for follow-up.  Contusion Left Foot -XR as above. -ACE bandage applied. -Educated on PRICE therapy.  Return in about 2 weeks (around 11/01/2018) for Nail Check, Bilateral.

## 2018-10-19 ENCOUNTER — Other Ambulatory Visit: Payer: Self-pay | Admitting: Podiatry

## 2018-10-19 DIAGNOSIS — M722 Plantar fascial fibromatosis: Secondary | ICD-10-CM

## 2018-10-19 DIAGNOSIS — S9032XA Contusion of left foot, initial encounter: Secondary | ICD-10-CM

## 2018-11-04 ENCOUNTER — Telehealth: Payer: Self-pay | Admitting: Podiatry

## 2018-11-04 NOTE — Telephone Encounter (Signed)
I called pt, pt states the right 1st toe very red, swollen with place at the base of the nail filled with pus. I offered pt an appt tomorrow with Dr. Jacqualyn Posey at 10:45am and she states she might wait until Monday.  I instructed pt to soak in 1 tablespoon betadine to 1 qt water. Pt states she would prefer to be seen tomorrow at 10:45am. Allen scheduled for 11/05/2018 at 10:45am.

## 2018-11-04 NOTE — Telephone Encounter (Signed)
Right great toe is red/swollen/looks like pus coming out of it, Dr. Cannon Kettle pt in [place].

## 2018-11-05 ENCOUNTER — Ambulatory Visit (INDEPENDENT_AMBULATORY_CARE_PROVIDER_SITE_OTHER): Payer: Managed Care, Other (non HMO) | Admitting: Podiatry

## 2018-11-05 ENCOUNTER — Encounter: Payer: Self-pay | Admitting: Podiatry

## 2018-11-05 DIAGNOSIS — L600 Ingrowing nail: Secondary | ICD-10-CM | POA: Diagnosis not present

## 2018-11-05 DIAGNOSIS — L03031 Cellulitis of right toe: Secondary | ICD-10-CM | POA: Diagnosis not present

## 2018-11-05 MED ORDER — CEPHALEXIN 500 MG PO CAPS: 500.0000 mg | ORAL_CAPSULE | Freq: Three times a day (TID) | ORAL | 0 refills | Status: AC

## 2018-11-05 MED ORDER — MUPIROCIN 2 % EX OINT: 1.0000 "application " | TOPICAL_OINTMENT | Freq: Two times a day (BID) | CUTANEOUS | 2 refills | Status: AC

## 2018-11-05 NOTE — Patient Instructions (Signed)

## 2018-11-07 NOTE — Progress Notes (Signed)
Subjective: 62 year old female presents the office today for concerns of possible infection to the right big toe.  She states that Dr. March Rummage a partial nail avulsion about 2 weeks ago and was doing well however over the last week she is noticed some redness and some purulent drainage coming from the nail site has been getting worse.  She presents today for further evaluation.  Denies any red streaks up her foot and she has tenderness with pressure. No recent injury. Denies any systemic complaints such as fevers, chills, nausea, vomiting. No acute changes since last appointment, and no other complaints at this time.   Objective: AAO x3, NAD DP/PT pulses palpable bilaterally, CRT less than 3 seconds There is localized edema and erythema to the proximal lateral border of the right hallux toenail over the area the previous partial nail avulsion.  There is no ascending cellulitis.  Minimal clear drainage is expressed.  There is no fluctuation or crepitation or any malodor. No open lesions or pre-ulcerative lesions.  No pain with calf compression, swelling, warmth, erythema  Assessment: Localized infection right hallux lateral nail border  Plan: -All treatment options discussed with the patient including all alternatives, risks, complications.  -There is no significant purulence I do not culture the area today.  However start antibiotics. Keflex ordered.  Recommend Epson salt soaks twice a day cover with antibiotic ointment and a bandage.  We will have her follow-up with Dr. March Rummage next week and if symptoms continue she may need to have a repeat procedure performed but hopefully we can calm down the localized infection of the antibiotics prior to having to have the procedure performed again if needed -Patient encouraged to call the office with any questions, concerns, change in symptoms.   Trula Slade DPM

## 2018-11-15 ENCOUNTER — Ambulatory Visit: Payer: Managed Care, Other (non HMO) | Admitting: Podiatry

## 2018-11-15 DIAGNOSIS — L600 Ingrowing nail: Secondary | ICD-10-CM | POA: Diagnosis not present

## 2018-11-15 DIAGNOSIS — M722 Plantar fascial fibromatosis: Secondary | ICD-10-CM | POA: Diagnosis not present

## 2018-11-15 DIAGNOSIS — M79676 Pain in unspecified toe(s): Secondary | ICD-10-CM

## 2018-11-15 MED ORDER — NEOMYCIN-POLYMYXIN-HC 3.5-10000-1 OT SOLN: OTIC | 0 refills | Status: AC

## 2018-11-15 NOTE — Patient Instructions (Signed)

## 2018-11-16 NOTE — Progress Notes (Signed)
Subjective:  Patient ID: [name], female    DOB: 02/13/1956,  MRN: [id number]  Chief Complaint  Patient presents with  . nail check    F/U R hallux nail check Pt. states," I think th etoes still looks infected and it's very sore; 3/810.": epsom salt, keflex, abx cream and bandaid  . Plantar Fasciitis    F/U BL PF Pt. stated," they pain comes and goes, it's still sore; 6/10." txL: NS    62 y.o. female presents with the above complaint.  States that the right great toe still is slightly ingrown.  States that she is having pain on the other side that was not treated.  Also reporting pain on the opposite side of the nail that was treated on the left great toe.  Would like to just discuss removing all the corners of ingrown nails today.  Continuing to have heel pain though it is not as bad as it was previous.   Review of Systems: Negative except as noted in the HPI. Denies N/V/F/Ch.  Past Medical History:  Diagnosis Date  . Arthritis    osteo  . Asthma   . Benign meningioma ([place]) 05-12-13   dx. '98- small, no problems  . Bronchitis 05-12-13   occ. bouts with bronchitis  . Cancer ([place]) 02/2010   left breast cancer  . History of cancer chemotherapy   . History of frequent urinary tract infections    last tx. 1 month ago  . PONV (postoperative nausea and vomiting)   . Recurrent sinus infections    last  2 months ago  . Wears partial dentures    partials top and bottom    Current Outpatient Medications:  .  albuterol (PROAIR HFA) 108 (90 Base) MCG/ACT inhaler, Inhale into the lungs., Disp: , Rfl:  .  alendronate (FOSAMAX) 70 MG tablet, Take 70 mg by mouth every Sunday. Take with a full glass of water on an empty stomach., Disp: , Rfl:  .  Calcium Carbonate-Vitamin D (CALCIUM-D) 600-400 MG-UNIT TABS, Take 1 tablet by mouth 2 (two) times daily., Disp: , Rfl:  .  Carbinoxamine Maleate 6 MG TABS, Take 1 tablet by mouth every 8 (eight) hours as needed., Disp: 90 tablet, Rfl: 3 .   cephALEXin (KEFLEX) 500 MG capsule, Take 1 capsule (500 mg total) by mouth 3 (three) times daily., Disp: 30 capsule, Rfl: 0 .  ciclopirox (PENLAC) 8 % solution, Apply topically at bedtime. Apply over nail and surrounding skin. Apply daily over previous coat. Remove weekly with file or polish remover., Disp: 6.6 mL, Rfl: 0 .  gabapentin (NEURONTIN) 300 MG capsule, Take by mouth., Disp: , Rfl:  .  ipratropium (ATROVENT) 0.06 % nasal spray, Place 2 sprays into both nostrils every 6 (six) hours as needed for rhinitis., Disp: 15 mL, Rfl: 5 .  meclizine (ANTIVERT) 25 MG tablet, Take by mouth., Disp: , Rfl:  .  Melatonin 10 MG TABS, Take 2 tablets by mouth at bedtime., Disp: , Rfl:  .  meloxicam (MOBIC) 15 MG tablet, Take 1 tablet (15 mg total) by mouth daily., Disp: 30 tablet, Rfl: 0 .  montelukast (SINGULAIR) 10 MG tablet, Take 1 tablet (10 mg total) by mouth at bedtime., Disp: 30 tablet, Rfl: 5 .  Multiple Vitamin (MULTIVITAMIN WITH MINERALS) TABS, Take 1 tablet by mouth daily., Disp: , Rfl:  .  mupirocin ointment (BACTROBAN) 2 %, Apply 1 application topically 2 (two) times daily., Disp: 30 g, Rfl: 2 .  neomycin-polymyxin-hydrocortisone (CORTISPORIN)  OTIC solution, Apply 2-3 drops to the ingrown toenail site twice daily. Cover with band-aid., Disp: 10 mL, Rfl: 0 .  Omega-3 Fatty Acids (FISH OIL) 500 MG CAPS, Take 1 capsule by mouth daily., Disp: , Rfl:  .  omeprazole (PRILOSEC) 40 MG capsule, Take 1 capsule (40 mg total) by mouth daily., Disp: 30 capsule, Rfl: 5 .  Polyethyl Glycol-Propyl Glycol (SYSTANE OP), Apply to eye., Disp: , Rfl:  .  predniSONE (STERAPRED UNI-PAK 21 TAB) 10 MG (21) TBPK tablet, 12 day dose pack follow directions on pack, Disp: , Rfl:  .  ranitidine (ZANTAC) 300 MG tablet, Take 1 tablet (300 mg total) by mouth at bedtime., Disp: 30 tablet, Rfl: 5 .  terbinafine (LAMISIL) 250 MG tablet, , Disp: , Rfl:  .  triamcinolone (NASACORT ALLERGY 24HR) 55 MCG/ACT AERO nasal inhaler, Place 1  spray into the nose daily., Disp: , Rfl:  .  Turmeric Curcumin 500 MG CAPS, Take 1 tablet by mouth daily., Disp: , Rfl:  .  vitamin C (ASCORBIC ACID) 500 MG tablet, Take 500 mg by mouth daily., Disp: , Rfl:  .  Vitamins/Minerals TABS, Take by mouth., Disp: , Rfl:   Social History   Tobacco Use  Smoking Status Former Smoker  . Packs/day: 1.00  . Years: 20.00  . Pack years: 20.00  . Types: Cigarettes  . Last attempt to quit: 05/12/2010  . Years since quitting: 8.5  Smokeless Tobacco Never Used    Allergies  Allergen Reactions  . Codeine Nausea Only  . Meperidine Nausea Only  . Other     "narcotics" causes nausea and vomiting   Objective:  There were no vitals filed for this visit. There is no height or weight on file to calculate BMI. Constitutional Well developed. Well nourished.  Vascular Dorsalis pedis pulses palpable bilaterally. Posterior tibial pulses palpable bilaterally. Capillary refill normal to all digits.  No cyanosis or clubbing noted. Pedal hair growth normal.  Neurologic Normal speech. Oriented to person, place, and time. Epicritic sensation to light touch grossly present bilaterally.  Dermatologic Painful ingrowing nail at both borders right great toe, lateral border left great toe No other open wounds. No skin lesions.  Orthopedic: Normal joint ROM without pain or crepitus bilaterally. No visible deformities. Pain palpation about the left medial calcaneal tuber   Radiographs: None Assessment:   1. Plantar fasciitis   2. Ingrown nail   3. Pain around toenail    Plan:  Patient was evaluated and treated and all questions answered.   Ingrown Nail, bilaterally -Patient elects to proceed with minor surgery to remove ingrown toenail removal today. Consent reviewed and signed by patient. -Ingrown nail excised. See procedure note. -Educated on post-procedure care including soaking. Written instructions provided and reviewed. -Patient to follow up in 2  weeks for nail check.  Procedure: Excision of Ingrown Toenail Location: Right 1st toe both borders, left great toe lateral border. Anesthesia: Lidocaine 1% plain; 1.5 mL and Marcaine 0.5% plain; 1.5 mL, digital block. Skin Prep: Betadine. Dressing: Silvadene; telfa; dry, sterile, compression dressing. Technique: Following skin prep, the toe was exsanguinated and a tourniquet was secured at the base of the toe. The affected nail border was freed, split with a nail splitter, and excised. Chemical matrixectomy was then performed with phenol and irrigated out with alcohol. The tourniquet was then removed and sterile dressing applied. Disposition: Patient tolerated procedure well. Patient to return in 2 weeks for follow-up.   Plantar fasciitis right -Repeat injection as below  Procedure: Injection Tendon/Ligament Consent: Verbal consent obtained. Location: Right plantar fascia at the glabrous junction; medial approach. Skin Prep: Alcohol. Injectate: 1 cc 0.5% marcaine plain, 1 cc dexamethasone phosphate, 0.5 cc kenalog 10. Disposition: Patient tolerated procedure well. Injection site dressed with a band-aid.  No follow-ups on file.

## 2018-11-29 ENCOUNTER — Ambulatory Visit: Payer: Managed Care, Other (non HMO) | Admitting: Podiatry

## 2019-02-02 ENCOUNTER — Ambulatory Visit (INDEPENDENT_AMBULATORY_CARE_PROVIDER_SITE_OTHER): Payer: Managed Care, Other (non HMO)

## 2019-02-02 ENCOUNTER — Encounter (INDEPENDENT_AMBULATORY_CARE_PROVIDER_SITE_OTHER): Payer: Self-pay | Admitting: Physician Assistant

## 2019-02-02 ENCOUNTER — Ambulatory Visit (INDEPENDENT_AMBULATORY_CARE_PROVIDER_SITE_OTHER): Payer: Managed Care, Other (non HMO) | Admitting: Physician Assistant

## 2019-02-02 DIAGNOSIS — G8929 Other chronic pain: Secondary | ICD-10-CM

## 2019-02-02 DIAGNOSIS — M542 Cervicalgia: Secondary | ICD-10-CM

## 2019-02-02 DIAGNOSIS — M25511 Pain in right shoulder: Secondary | ICD-10-CM

## 2019-02-02 DIAGNOSIS — M722 Plantar fascial fibromatosis: Secondary | ICD-10-CM | POA: Diagnosis not present

## 2019-02-02 NOTE — Progress Notes (Signed)
Office Visit Note   Patient: [name]           Date of Birth: 1956-06-16           MRN: [id number] Visit Date: 02/02/2019              Requested by: Imagene Riches, NP [place],  [id number] PCP: Imagene Riches, NP   Assessment & Plan: Visit Diagnoses:  1. Neck pain   2. Chronic right shoulder pain   3. Plantar fasciitis, left     Plan: In regards to her plantar fasciitis advised to change her shoes out daily.  She is to work on gastrocsoleus stretching exercises as discussed and shown to her today. Will obtain a MRI of her cervical spine to rule out spinal stenosis and evaluate calcific changes anterior to C3-C4.  She will follow-up after the MRI to go over the results and discuss further treatment.  Questions encouraged and answered at length.  Offered her a muscle relaxant she defers.  She has been on a recent Medrol Dosepak and therefore would not repeat.  She will take ibuprofen 600 mg twice daily.  Apply moist heat to the neck.   Follow-Up Instructions: Return for After MRI.   Orders:  Orders Placed This Encounter  Procedures  . XR Cervical Spine 2 or 3 views  . XR Shoulder Right   No orders of the defined types were placed in this encounter.     Procedures: No procedures performed   Clinical Data: No additional findings.   Subjective: Chief Complaint  Patient presents with  . Neck - Pain  . Right Shoulder - Pain  . Left Foot - Pain    HPI [name] is well-known to Dr. Trevor Mace service comes in today with neck pain in the right shoulder pain.  Also complained of left foot pain.  She is been absent from our practice for 5 years.  She notes that she has had pain in the neck and shoulder for the past few months.  She states she cannot turn her neck without having pain particularly to the right.  She feels as if the shoulder is weak.  She is had no known injury.  She feels that she has decreased strength also in the right arm.  She has  tingling that goes down the arm into the hand.  She states she feels as if the forearm is sore.  In regards to her left foot pain she is seeing a podiatrist for the foot.  She states she is been diagnosed with plantar fasciitis.  She is been given 3 injections but if it helps some but she still has pain in the left foot particularly the heel region and under the metatarsal head region.  She does not wear any open back shoes.  She is had no known injury to the foot. Review of Systems See HPI otherwise negative  Objective: Vital Signs: There were no vitals taken for this visit.  Physical Exam Constitutional:      Appearance: She is not ill-appearing or diaphoretic.  Pulmonary:     Effort: Pulmonary effort is normal.  Neurological:     Mental Status: She is alert and oriented to person, place, and time.  Psychiatric:        Mood and Affect: Mood normal.     Ortho Exam Left foot she has tenderness medial tubercle calcaneus.  Also tenderness under second third metatarsal heads.  Remainder foot  is nontender.  Tight gastroc on the left.  Calf supple nontender.  Achilles is intact and nontender.  Cervical spine she has a positive Spurling's.  She has discomfort with rotation of the cervical spine to the right and slight discomfort with rotation the left.  She has good flexion-extension cervical spine without pain.  Tenderness over the cervical spinal column.  She has tenderness right greater than left medial scapular border.  Bilateral shoulders 5 and a 5 strength with external/internal rotation against resistance.  Negative impingement testing bilaterally.  Empty can test is negative bilaterally.  Sensation grossly intact bilateral hands except for the tips of all the fingers bilaterally.  Full motor bilateral hands.  Radial pulses are 2+ bilaterally.  Compression testing of the median nerve is negative bilaterally.  Negative Tinel's bilaterally.  Phalen's is negative bilaterally. Specialty Comments:   No specialty comments available.  Imaging: Xr Cervical Spine 2 Or 3 Views  Result Date: 02/02/2019 Cervical spine: No acute fracture. Loss of lordotic curvature. Endplate spurring mid cervical spine. Grade II anterior spondylolisthesis C3 -C4. Calcification anterior to C3-C4 disc space.  Compared to MRI in 2014 cervical spine spondylolisthesis has increased from grade 1 to grade 2 at C3-C4.   Xr Shoulder Right  Result Date: 02/02/2019 3 views right shoulder: No acute fracture. Shoulder well located. Glenohumeral joint well maintained.     PMFS History: Patient Active Problem List   Diagnosis Date Noted  . Mild intermittent asthma without complication 30/16/0109  . Asthma, well controlled 03/27/2018  . LPRD (laryngopharyngeal reflux disease) 03/27/2018  . Other allergic rhinitis 03/27/2018  . Chronic pansinusitis 03/27/2018  . Trochanteric bursitis, right hip 03/23/2018  . Acute right-sided low back pain with right-sided sciatica 03/23/2018  . Plantar fasciitis 08/03/2017  . Allergic rhinitis 09/18/2014  . Reactive airway disease 09/03/2014  . Personal history of breast cancer 03/27/2014  . Status post left breast reconstruction 03/27/2014  . Status post right breast implant 03/27/2014  . 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 Arthritis pain of hip 07/07/2013  . Postoperative anemia due to acute blood loss 05/28/2013    Class: Acute  . Acute bronchitis 05/27/2013  . Acute maxillary sinusitis 05/27/2013  . Anxiety state 05/27/2013  . Depressive disorder 05/27/2013  . Encounter for long-term (current) use of other medications 05/27/2013  . Malignant neoplasm of breast (female) ([place]) 05/27/2013  . Osteoarthrosis 05/27/2013  . Other specified disorders of nose and nasal sinuses 05/27/2013  . Pain in joints 05/27/2013  . Paronychia of finger 05/27/2013  . Wheezing 05/27/2013  . Low back pain 05/27/2013  . Sprain and strain of foot 05/27/2013  . Degenerative  arthritis of hip 05/26/2013  . Acquired absence of breast and absent nipple 07/08/2012   Past Medical History:  Diagnosis Date  . Arthritis    osteo  . Asthma   . Benign meningioma ([place]) 05-12-13   dx. '98- small, no problems  . Bronchitis 05-12-13   occ. bouts with bronchitis  . Cancer ([place]) 02/2010   left breast cancer  . History of cancer chemotherapy   . History of frequent urinary tract infections    last tx. 1 month ago  . PONV (postoperative nausea and vomiting)   . Recurrent sinus infections    last  2 months ago  . Wears partial dentures    partials top and bottom    Family History  Problem Relation Age of Onset  . Colon cancer Mother   .  High blood pressure Sister   . High blood pressure Sister     Past Surgical History:  Procedure Laterality Date  . back kyoplasty  2017  . BREAST RECONSTRUCTION WITH PLACEMENT OF TISSUE EXPANDER AND FLEX HD (ACELLULAR HYDRATED DERMIS) Left 12/28/2013   Procedure: BREAST RECONSTRUCTION WITH PLACEMENT OF TISSUE EXPANDER AND FLEX HD TO LEFT BREAST  (ACELLULAR HYDRATED DERMIS);  Surgeon: Theodoro Kos, DO;  Location: [place];  Service: Plastics;  Laterality: Left;  . BREAST SURGERY Left 2011   '11- left breast mastectomy  . CHOLECYSTECTOMY    . MASTOPEXY Right 03/15/2014   Procedure: RIGHT BREAST MASTOPEXY;  Surgeon: Theodoro Kos, DO;  Location: [place];  Service: Plastics;  Laterality: Right;  . port-acath  05-12-13   insertion and removal  . PUNCH BIOPSY OF SKIN Right 03/15/2014   Procedure: PUNCH BIOPSY OF SKIN x2 right chest and midline chest;  Surgeon: Theodoro Kos, DO;  Location: [facility];  Service: Plastics;  Laterality: Right;  . REMOVAL OF BILATERAL TISSUE EXPANDERS WITH PLACEMENT OF BILATERAL BREAST IMPLANTS Bilateral 03/15/2014   Procedure: REMOVAL OF LEFT  TISSUE EXPANDERS WITH PLACEMENT OF BILATERAL BREAST IMPLANTS ;  Surgeon: Theodoro Kos, DO;  Location: [facility];  Service: Plastics;  Laterality: Bilateral;  . SINOSCOPY  04/2017  . TOTAL HIP ARTHROPLASTY Left 05/26/2013   Procedure: Left TOTAL HIP ARTHROPLASTY ANTERIOR APPROACH;  Surgeon: Mcarthur Rossetti, MD;  Location: [facility];  Service: Orthopedics;  Laterality: Left;  . TOTAL HIP ARTHROPLASTY Right 07/07/2013   Procedure: RIGHT TOTAL HIP ARTHROPLASTY ANTERIOR APPROACH;  Surgeon: Mcarthur Rossetti, MD;  Location: [facility];  Service: Orthopedics;  Laterality: Right;  . TUBAL LIGATION     Social History   Occupational History  . Not on file  Tobacco Use  . Smoking status: Former Smoker    Packs/day: 1.00    Years: 20.00    Pack years: 20.00    Types: Cigarettes    Last attempt to quit: 05/12/2010    Years since quitting: 8.7  . Smokeless tobacco: Never Used  Substance and Sexual Activity  . Alcohol use: Yes    Comment: social  . Drug use: No  . Sexual activity: Not Currently

## 2019-02-03 ENCOUNTER — Telehealth (INDEPENDENT_AMBULATORY_CARE_PROVIDER_SITE_OTHER): Payer: Self-pay | Admitting: Physician Assistant

## 2019-02-03 ENCOUNTER — Other Ambulatory Visit (INDEPENDENT_AMBULATORY_CARE_PROVIDER_SITE_OTHER): Payer: Self-pay

## 2019-02-03 DIAGNOSIS — M4807 Spinal stenosis, lumbosacral region: Secondary | ICD-10-CM

## 2019-02-03 MED ORDER — METHOCARBAMOL 500 MG PO TABS: 500.0000 mg | ORAL_TABLET | Freq: Three times a day (TID) | ORAL | 0 refills | Status: DC

## 2019-02-03 NOTE — Telephone Encounter (Signed)
Patient called asked if the Rx can be called in for her for the muscle relaxer. Patient said it was not Flexeril that Artis Delay was going to call in for her. Patient also asked if she can take prednisone after taking it a month ago? Patient said she would like a Rx for prednisone if Artis Delay think It is ok to take. Patient said she had a hard time sleeping. Patient also want to get an MRI done in [facility] as soon as possible. The number to contact patient is  [phone]

## 2019-02-03 NOTE — Telephone Encounter (Signed)
Robaxin 500 mg  1 po TID #40 zero No prednisone she just had it a month ago we spoke about that at her appointment.

## 2019-02-03 NOTE — Telephone Encounter (Signed)
MRI ok too?

## 2019-02-03 NOTE — Telephone Encounter (Signed)
Please advise 

## 2019-02-06 ENCOUNTER — Other Ambulatory Visit (INDEPENDENT_AMBULATORY_CARE_PROVIDER_SITE_OTHER): Payer: Self-pay

## 2019-02-06 NOTE — Telephone Encounter (Signed)
Yes of c-spine  Please see office note

## 2019-03-14 ENCOUNTER — Ambulatory Visit: Payer: Managed Care, Other (non HMO) | Admitting: Podiatry

## 2019-03-29 ENCOUNTER — Ambulatory Visit (INDEPENDENT_AMBULATORY_CARE_PROVIDER_SITE_OTHER): Payer: Managed Care, Other (non HMO) | Admitting: Orthopaedic Surgery

## 2019-03-29 ENCOUNTER — Encounter (INDEPENDENT_AMBULATORY_CARE_PROVIDER_SITE_OTHER): Payer: Self-pay | Admitting: Orthopaedic Surgery

## 2019-03-29 ENCOUNTER — Other Ambulatory Visit: Payer: Self-pay

## 2019-03-29 ENCOUNTER — Ambulatory Visit (INDEPENDENT_AMBULATORY_CARE_PROVIDER_SITE_OTHER): Payer: Managed Care, Other (non HMO)

## 2019-03-29 DIAGNOSIS — M7541 Impingement syndrome of right shoulder: Secondary | ICD-10-CM

## 2019-03-29 DIAGNOSIS — Z96643 Presence of artificial hip joint, bilateral: Secondary | ICD-10-CM | POA: Diagnosis not present

## 2019-03-29 DIAGNOSIS — M542 Cervicalgia: Secondary | ICD-10-CM | POA: Diagnosis not present

## 2019-03-29 DIAGNOSIS — M25551 Pain in right hip: Secondary | ICD-10-CM

## 2019-03-29 MED ORDER — METHYLPREDNISOLONE ACETATE 40 MG/ML IJ SUSP
40.0000 mg | INTRAMUSCULAR | Status: AC | PRN
  Administered 2019-03-29: 40 mg via INTRA_ARTICULAR

## 2019-03-29 MED ORDER — LIDOCAINE HCL 1 % IJ SOLN
3.0000 mL | INTRAMUSCULAR | Status: AC | PRN
  Administered 2019-03-29: 3 mL

## 2019-03-29 NOTE — Progress Notes (Signed)
Office Visit Note   Patient: [name]           Date of Birth: [birth year]           MRN: [id number] Visit Date: 03/29/2019              Requested by: Imagene Riches, NP [address], [place] [address] PCP: Imagene Riches, NP   Assessment & Plan: Visit Diagnoses:  1. Pain in right hip   2. Neck pain   3. Impingement syndrome of right shoulder   4. History of bilateral total hip arthroplasty     Plan: We were able to place a steroid injection per her wishes in her right shoulder today and I agree with doing so since it helped so significantly with her left shoulder.  I did give her a copy of her MRI report from her cervical spine.  Right now she is just only dealing with stiffness and pain in the neck and she will work on just activity modification as well as anti-inflammatories and alternating ice and heat for her neck.  We will just watch her right hip for now since her symptoms seem to be calming down.  If this worsens anyway she will let us know.  All question concerns were answered and addressed.  Follow-up as otherwise as needed.  Follow-Up Instructions: Return if symptoms worsen or fail to improve.   Orders:  Orders Placed This Encounter  Procedures  . Large Joint Inj  . XR HIP UNILAT W OR W/O PELVIS 1V RIGHT   No orders of the defined types were placed in this encounter.     Procedures: Large Joint Inj: R subacromial bursa on 03/29/2019 1:16 PM Indications: pain and diagnostic evaluation Details: 22 G 1.5 in needle  Arthrogram: No  Medications: 3 mL lidocaine 1 %; 40 mg methylPREDNISolone acetate 40 MG/ML Outcome: tolerated well, no immediate complications Procedure, treatment alternatives, risks and benefits explained, specific risks discussed. Consent was given by the patient. Immediately prior to procedure a time out was called to verify the correct patient, procedure, equipment, support staff and site/side marked as required. Patient was prepped and draped  in the usual sterile fashion.       Clinical Data: No additional findings.   Subjective: Chief Complaint  Patient presents with  . Right Hip - Pain  The patient is well-known to me.  She comes in today for 2 issues.  One is to go over an MRI of her cervical spine there is significant neck pain and stiffness.  She does report today right shoulder pain and stiffness.  She actually had an injection in her left shoulder remotely that helped greatly for impingement syndrome of the left shoulder.  She is requesting injection in her right shoulder today for impingement syndrome in terms of the pain she is describing and how this affects her activity living her mobility especially with pain reaching behind her and overhead.  She still denies any numbness and tingling in her hands or weakness in her arms.  Recently she was getting out of the shower and felt something just pop in her right hip when she was pulling her pants on painful she said is been a little bit painful to walk.  She is walking without a limp or an assistive device today but does report some pain around her right hip.  She does not feel as if things were coming out of place but she did feel some  type of pop in that hip.  She is been surgery for over 5 years now I believe with a hip replacement.  She denies any evidence of her of hip instability since her surgery.  HPI  Review of Systems She currently denies any headache, chest pain, shortness of breath, fever, chills, nausea, vomiting.  Objective: Vital Signs: There were no vitals taken for this visit.  Physical Exam She is alert and oriented x3 and in no acute distress or obvious discomfort Ortho Exam Examination of her right hip shows that moves fluidly with no significant discomfort at all.  She reports just some mild pain.  Her leg lengths are equal.  I can compress the hip and it causes no pain.  There is no significant pain on extremes of rotation either.  Examination of her  bilateral upper extremities does show some signs of impingement on the right shoulder but otherwise good range of motion and good strength throughout all muscle groups.  Her sensation is normal and her grip strength is normal. Specialty Comments:  No specialty comments available.  Imaging: Xr Hip Unilat W Or W/o Pelvis 1v Right  Result Date: 03/29/2019 An AP pelvis and lateral of the right hip shows bilateral total hip arthroplasties.  There is no acute findings of either hip or evidence of any hardware failure or complicating features.    PMFS History: Patient Active Problem List   Diagnosis Date Noted  . History of bilateral total hip arthroplasty 03/29/2019  . Mild intermittent asthma without complication 95/63/8756  . Asthma, well controlled 03/27/2018  . LPRD (laryngopharyngeal reflux disease) 03/27/2018  . Other allergic rhinitis 03/27/2018  . Chronic pansinusitis 03/27/2018  . Trochanteric bursitis, right hip 03/23/2018  . Acute right-sided low back pain with right-sided sciatica 03/23/2018  . Plantar fasciitis 08/03/2017  . Allergic rhinitis 09/18/2014  . Reactive airway disease 09/03/2014  . Personal history of breast cancer 03/27/2014  . Status post left breast reconstruction 03/27/2014  . Status post right breast implant 03/27/2014  . 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 Arthritis pain of hip 07/07/2013  . Postoperative anemia due to acute blood loss 05/28/2013    Class: Acute  . Acute bronchitis 05/27/2013  . Acute maxillary sinusitis 05/27/2013  . Anxiety state 05/27/2013  . Depressive disorder 05/27/2013  . Encounter for long-term (current) use of other medications 05/27/2013  . Malignant neoplasm of breast (female) ([facility]) 05/27/2013  . Osteoarthrosis 05/27/2013  . Other specified disorders of nose and nasal sinuses 05/27/2013  . Pain in joints 05/27/2013  . Paronychia of finger 05/27/2013  . Wheezing 05/27/2013  . Low back pain 05/27/2013   . Sprain and strain of foot 05/27/2013  . Degenerative arthritis of hip 05/26/2013  . Acquired absence of breast and absent nipple 07/08/2012   Past Medical History:  Diagnosis Date  . Arthritis    osteo  . Asthma   . Benign meningioma ([facility]) 05-12-13   dx. '98- small, no problems  . Bronchitis 05-12-13   occ. bouts with bronchitis  . Cancer ([facility]) 02/2010   left breast cancer  . History of cancer chemotherapy   . History of frequent urinary tract infections    last tx. 1 month ago  . PONV (postoperative nausea and vomiting)   . Recurrent sinus infections    last  2 months ago  . Wears partial dentures    partials top and bottom    Family History  Problem Relation Age of Onset  .  Colon cancer Mother   . High blood pressure Sister   . High blood pressure Sister     Past Surgical History:  Procedure Laterality Date  . back kyoplasty  2017  . BREAST RECONSTRUCTION WITH PLACEMENT OF TISSUE EXPANDER AND FLEX HD (ACELLULAR HYDRATED DERMIS) Left 12/28/2013   Procedure: BREAST RECONSTRUCTION WITH PLACEMENT OF TISSUE EXPANDER AND FLEX HD TO LEFT BREAST  (ACELLULAR HYDRATED DERMIS);  Surgeon: Theodoro Kos, DO;  Location: [facility];  Service: Plastics;  Laterality: Left;  . BREAST SURGERY Left 2011   '11- left breast mastectomy  . CHOLECYSTECTOMY    . MASTOPEXY Right 03/15/2014   Procedure: RIGHT BREAST MASTOPEXY;  Surgeon: Theodoro Kos, DO;  Location: [facility];  Service: Plastics;  Laterality: Right;  . port-acath  05-12-13   insertion and removal  . PUNCH BIOPSY OF SKIN Right 03/15/2014   Procedure: PUNCH BIOPSY OF SKIN x2 right chest and midline chest;  Surgeon: Theodoro Kos, DO;  Location: [facility];  Service: Plastics;  Laterality: Right;  . REMOVAL OF BILATERAL TISSUE EXPANDERS WITH PLACEMENT OF BILATERAL BREAST IMPLANTS Bilateral 03/15/2014   Procedure: REMOVAL OF LEFT  TISSUE EXPANDERS WITH PLACEMENT OF BILATERAL BREAST IMPLANTS ;   Surgeon: Theodoro Kos, DO;  Location: [facility];  Service: Plastics;  Laterality: Bilateral;  . SINOSCOPY  04/2017  . TOTAL HIP ARTHROPLASTY Left 05/26/2013   Procedure: Left TOTAL HIP ARTHROPLASTY ANTERIOR APPROACH;  Surgeon: Mcarthur Rossetti, MD;  Location[facility];  Service: Orthopedics;  Laterality: Left;  . TOTAL HIP ARTHROPLASTY Right 07/07/2013   Procedure: RIGHT TOTAL HIP ARTHROPLASTY ANTERIOR APPROACH;  Surgeon: Mcarthur Rossetti, MD;  Location: [facility];  Service: Orthopedics;  Laterality: Right;  . TUBAL LIGATION     Social History   Occupational History  . Not on file  Tobacco Use  . Smoking status: Former Smoker    Packs/day: 1.00    Years: 20.00    Pack years: 20.00    Types: Cigarettes    Last attempt to quit: 05/12/2010    Years since quitting: 8.8  . Smokeless tobacco: Never Used  Substance and Sexual Activity  . Alcohol use: Yes    Comment: social  . Drug use: No  . Sexual activity: Not Currently

## 2019-03-30 ENCOUNTER — Ambulatory Visit (INDEPENDENT_AMBULATORY_CARE_PROVIDER_SITE_OTHER): Payer: Managed Care, Other (non HMO) | Admitting: Orthopaedic Surgery

## 2019-10-06 DIAGNOSIS — Z9012 Acquired absence of left breast and nipple: Secondary | ICD-10-CM | POA: Diagnosis not present

## 2019-10-06 DIAGNOSIS — Z853 Personal history of malignant neoplasm of breast: Secondary | ICD-10-CM | POA: Diagnosis not present

## 2020-05-29 ENCOUNTER — Other Ambulatory Visit: Payer: Self-pay

## 2020-05-29 ENCOUNTER — Encounter: Payer: Self-pay | Admitting: Vascular Surgery

## 2020-05-29 ENCOUNTER — Ambulatory Visit (INDEPENDENT_AMBULATORY_CARE_PROVIDER_SITE_OTHER): Payer: Managed Care, Other (non HMO) | Admitting: Vascular Surgery

## 2020-05-29 VITALS — BP 113/70 | HR 73 | Temp 97.2°F | Resp 14 | Ht 66.0 in | Wt 151.0 lb

## 2020-05-29 DIAGNOSIS — I6521 Occlusion and stenosis of right carotid artery: Secondary | ICD-10-CM | POA: Diagnosis not present

## 2020-05-29 MED ORDER — ASPIRIN EC 81 MG PO TBEC: 81.0000 mg | DELAYED_RELEASE_TABLET | Freq: Every day | ORAL | 2 refills | Status: DC

## 2020-05-29 MED ORDER — ROSUVASTATIN CALCIUM 10 MG PO TABS: 10.0000 mg | ORAL_TABLET | Freq: Every day | ORAL | 12 refills | Status: DC

## 2020-05-29 NOTE — Progress Notes (Signed)
Referring Physician: Heide Scales FNP  Patient name: [name] MRN: [id number] DOB: 1956-10-25 Sex: female  REASON FOR CONSULT: right carotid stenosis  HPI: [name] is a 64 y.o. female, who was found to have calcification of her carotid artery on exam for musculoskeletal issue on the left side.  Subsequent carotid duplex exam showed a right internal carotid artery stenosis.  She has no symptoms of TIA amaurosis or stroke.  She has never been on aspirin or statin.  She does have family history of carotid occlusive disease in multiple family members.  She is a former smoker but quit in 2011.  Other medical problems include degenerative arthritis, bronchitis, prior history of breast cancer.  All of these are currently stable.  Past Medical History:  Diagnosis Date  . Arthritis    osteo  . Asthma   . Benign meningioma ([facility]) 05-12-13   dx. '98- small, no problems  . Bronchitis 05-12-13   occ. bouts with bronchitis  . Cancer ([facility]) 02/2010   left breast cancer  . History of cancer chemotherapy   . History of frequent urinary tract infections    last tx. 1 month ago  . PONV (postoperative nausea and vomiting)   . Recurrent sinus infections    last  2 months ago  . Wears partial dentures    partials top and bottom   Past Surgical History:  Procedure Laterality Date  . back kyoplasty  2017  . BREAST RECONSTRUCTION WITH PLACEMENT OF TISSUE EXPANDER AND FLEX HD (ACELLULAR HYDRATED DERMIS) Left 12/28/2013   Procedure: BREAST RECONSTRUCTION WITH PLACEMENT OF TISSUE EXPANDER AND FLEX HD TO LEFT BREAST  (ACELLULAR HYDRATED DERMIS);  Surgeon: Theodoro Kos, DO;  Location: [facility];  Service: Plastics;  Laterality: Left;  . BREAST SURGERY Left 2011   '11- left breast mastectomy  . CHOLECYSTECTOMY    . MASTOPEXY Right 03/15/2014   Procedure: RIGHT BREAST MASTOPEXY;  Surgeon: Theodoro Kos, DO;  Location: [facility];  Service: Plastics;  Laterality:  Right;  . port-acath  05-12-13   insertion and removal  . PUNCH BIOPSY OF SKIN Right 03/15/2014   Procedure: PUNCH BIOPSY OF SKIN x2 right chest and midline chest;  Surgeon: Theodoro Kos, DO;  Location: [facility];  Service: Plastics;  Laterality: Right;  . REMOVAL OF BILATERAL TISSUE EXPANDERS WITH PLACEMENT OF BILATERAL BREAST IMPLANTS Bilateral 03/15/2014   Procedure: REMOVAL OF LEFT  TISSUE EXPANDERS WITH PLACEMENT OF BILATERAL BREAST IMPLANTS ;  Surgeon: Theodoro Kos, DO;  Location: [facility];  Service: Plastics;  Laterality: Bilateral;  . SINOSCOPY  04/2017  . TOTAL HIP ARTHROPLASTY Left 05/26/2013   Procedure: Left TOTAL HIP ARTHROPLASTY ANTERIOR APPROACH;  Surgeon: Mcarthur Rossetti, MD;  [facility];  Service: Orthopedics;  Laterality: Left;  . TOTAL HIP ARTHROPLASTY Right 07/07/2013   Procedure: RIGHT TOTAL HIP ARTHROPLASTY ANTERIOR APPROACH;  Surgeon: Mcarthur Rossetti, MD;  [facility];  Service: Orthopedics;  Laterality: Right;  . TUBAL LIGATION      Family History  Problem Relation Age of Onset  . Colon cancer Mother   . High blood pressure Sister   . High blood pressure Sister     SOCIAL HISTORY: Social History   Socioeconomic History  . Marital status: Single    Spouse name: Not on file  . Number of children: Not on file  . Years of education: Not on file  . Highest education level: Not  on file  Occupational History  . Not on file  Tobacco Use  . Smoking status: Former Smoker    Packs/day: 1.00    Years: 20.00    Pack years: 20.00    Types: Cigarettes    Quit date: 05/12/2010    Years since quitting: 10.0  . Smokeless tobacco: Never Used  Substance and Sexual Activity  . Alcohol use: Yes    Comment: social  . Drug use: No  . Sexual activity: Not Currently  Other Topics Concern  . Not on file  Social History Narrative  . Not on file   Social Determinants of Health   Financial Resource Strain:   .  Difficulty of Paying Living Expenses:   Food Insecurity:   . Worried About Charity fundraiser in the Last Year:   . Arboriculturist in the Last Year:   Transportation Needs:   . Film/video editor (Medical):   Marland Kitchen Lack of Transportation (Non-Medical):   Physical Activity:   . Days of Exercise per Week:   . Minutes of Exercise per Session:   Stress:   . Feeling of Stress :   Social Connections:   . Frequency of Communication with Friends and Family:   . Frequency of Social Gatherings with Friends and Family:   . Attends Religious Services:   . Active Member of Clubs or Organizations:   . Attends Archivist Meetings:   Marland Kitchen Marital Status:   Intimate Partner Violence:   . Fear of Current or Ex-Partner:   . Emotionally Abused:   Marland Kitchen Physically Abused:   . Sexually Abused:     Allergies  Allergen Reactions  . Codeine Nausea Only  . Meperidine Nausea Only  . Other     "narcotics" causes nausea and vomiting    Current Outpatient Medications  Medication Sig Dispense Refill  . albuterol (PROAIR HFA) 108 (90 Base) MCG/ACT inhaler Inhale into the lungs.    Marland Kitchen alendronate (FOSAMAX) 70 MG tablet Take 70 mg by mouth every Sunday. Take with a full glass of water on an empty stomach.    . Calcium Carbonate-Vitamin D (CALCIUM-D) 600-400 MG-UNIT TABS Take 1 tablet by mouth 2 (two) times daily.    Marland Kitchen gabapentin (NEURONTIN) 300 MG capsule Take by mouth.    . Melatonin 10 MG TABS Take 2 tablets by mouth at bedtime.    . meloxicam (MOBIC) 15 MG tablet Take 1 tablet (15 mg total) by mouth daily. 30 tablet 0  . methocarbamol (ROBAXIN) 500 MG tablet Take 1 tablet (500 mg total) by mouth 3 (three) times daily. 40 tablet 0  . Multiple Vitamin (MULTIVITAMIN WITH MINERALS) TABS Take 1 tablet by mouth daily.    . Omega-3 Fatty Acids (FISH OIL) 500 MG CAPS Take 1 capsule by mouth daily.    Marland Kitchen omeprazole (PRILOSEC) 40 MG capsule Take 1 capsule (40 mg total) by mouth daily. 30 capsule 5  .  Turmeric Curcumin 500 MG CAPS Take 1 tablet by mouth daily.    . vitamin C (ASCORBIC ACID) 500 MG tablet Take 500 mg by mouth daily.    . Vitamins/Minerals TABS Take by mouth.    Marland Kitchen aspirin EC 81 MG tablet Take 1 tablet (81 mg total) by mouth daily. 150 tablet 2  . Carbinoxamine Maleate 6 MG TABS Take 1 tablet by mouth every 8 (eight) hours as needed. (Patient not taking: Reported on 05/29/2020) 90 tablet 3  . cephALEXin (KEFLEX) 500 MG capsule  Take 1 capsule (500 mg total) by mouth 3 (three) times daily. (Patient not taking: Reported on 05/29/2020) 30 capsule 0  . ciclopirox (PENLAC) 8 % solution Apply topically at bedtime. Apply over nail and surrounding skin. Apply daily over previous coat. Remove weekly with file or polish remover. (Patient not taking: Reported on 05/29/2020) 6.6 mL 0  . ipratropium (ATROVENT) 0.06 % nasal spray Place 2 sprays into both nostrils every 6 (six) hours as needed for rhinitis. (Patient not taking: Reported on 05/29/2020) 15 mL 5  . meclizine (ANTIVERT) 25 MG tablet Take by mouth.    . montelukast (SINGULAIR) 10 MG tablet Take 1 tablet (10 mg total) by mouth at bedtime. (Patient not taking: Reported on 05/29/2020) 30 tablet 5  . mupirocin ointment (BACTROBAN) 2 % Apply 1 application topically 2 (two) times daily. (Patient not taking: Reported on 05/29/2020) 30 g 2  . neomycin-polymyxin-hydrocortisone (CORTISPORIN) OTIC solution Apply 2-3 drops to the ingrown toenail site twice daily. Cover with band-aid. (Patient not taking: Reported on 05/29/2020) 10 mL 0  . Polyethyl Glycol-Propyl Glycol (SYSTANE OP) Apply to eye.    . predniSONE (STERAPRED UNI-PAK 21 TAB) 10 MG (21) TBPK tablet 12 day dose pack follow directions on pack    . ranitidine (ZANTAC) 300 MG tablet Take 1 tablet (300 mg total) by mouth at bedtime. (Patient not taking: Reported on 05/29/2020) 30 tablet 5  . rosuvastatin (CRESTOR) 10 MG tablet Take 1 tablet (10 mg total) by mouth daily. 30 tablet 12  . terbinafine (LAMISIL)  250 MG tablet     . triamcinolone (NASACORT ALLERGY 24HR) 55 MCG/ACT AERO nasal inhaler Place 1 spray into the nose daily.     No current facility-administered medications for this visit.    ROS:   General:  No weight loss, Fever, chills  HEENT: No recent headaches, no nasal bleeding, no visual changes, no sore throat  Neurologic: No dizziness, blackouts, seizures. No recent symptoms of stroke or mini- stroke. No recent episodes of slurred speech, or temporary blindness.  Cardiac: No recent episodes of chest pain/pressure, no shortness of breath at rest.  No shortness of breath with exertion.  Denies history of atrial fibrillation or irregular heartbeat  Vascular: No history of rest pain in feet.  No history of claudication.  No history of non-healing ulcer, No history of DVT   Pulmonary: No home oxygen, no productive cough, no hemoptysis,  No asthma or wheezing  Musculoskeletal:  [X]  Arthritis, [ ]  Low back pain,  [X]  Joint pain  Hematologic:No history of hypercoagulable state.  No history of easy bleeding.  No history of anemia  Gastrointestinal: No hematochezia or melena,  No gastroesophageal reflux, no trouble swallowing  Urinary: [ ]  chronic Kidney disease, [ ]  on HD - [ ]  MWF or [ ]  TTHS, [ ]  Burning with urination, [ ]  Frequent urination, [ ]  Difficulty urinating;   Skin: No rashes  Psychological: No history of anxiety,  No history of depression   Physical Examination  Vitals:   05/29/20 1440 05/29/20 1444  BP: 108/64 113/70  Pulse: 72 73  Resp: 14   Temp: (!) 97.2 F (36.2 C)   TempSrc: Temporal   SpO2: 99%   Weight: 151 lb (68.5 kg)   Height: 5\' 6"  (1.676 m)     Body mass index is 24.37 kg/m.  General:  Alert and oriented, no acute distress HEENT: Normal Neck: No JVD Cardiac: Regular Rate and Rhythm  Skin: No rash Extremity Pulses:  2+ dorsalis pedis pulses bilaterally Musculoskeletal: No deformity or edema  Neurologic: Upper and lower extremity  motor 5/5 and symmetric  DATA:  Patient had a carotid duplex exam at [facility] radiology on May 14, 2020.  This showed a 4 to 70% right internal carotid artery stenosis with no significant left internal carotid artery stenosis.  ASSESSMENT: Moderate right internal carotid artery stenosis currently asymptomatic   PLAN: 1.  Patient will be seen in APP clinic with a repeat carotid duplex exam in 6 months time.  She will be started today on aspirin 81 mg once a day and Crestor 10 mg once a day.  Patient was educated on signs and symptoms of stroke and TIA.  If she has any symptoms she will go to the emergency room and also alert [place]   Susan Hinds, MD [facility]: [phone] Pager: [phone]

## 2020-06-04 ENCOUNTER — Other Ambulatory Visit: Payer: Self-pay | Admitting: *Deleted

## 2020-06-04 DIAGNOSIS — I6521 Occlusion and stenosis of right carotid artery: Secondary | ICD-10-CM

## 2020-06-18 ENCOUNTER — Other Ambulatory Visit: Payer: Self-pay

## 2020-06-18 ENCOUNTER — Ambulatory Visit: Payer: Managed Care, Other (non HMO) | Admitting: Podiatry

## 2020-06-18 DIAGNOSIS — L600 Ingrowing nail: Secondary | ICD-10-CM

## 2020-06-18 DIAGNOSIS — M722 Plantar fascial fibromatosis: Secondary | ICD-10-CM

## 2020-06-18 DIAGNOSIS — G5763 Lesion of plantar nerve, bilateral lower limbs: Secondary | ICD-10-CM | POA: Diagnosis not present

## 2020-06-18 MED ORDER — NEOMYCIN-POLYMYXIN-HC 3.5-10000-1 OT SOLN
OTIC | 0 refills | Status: AC
Start: 2020-06-18 — End: ?

## 2020-06-18 NOTE — Patient Instructions (Signed)

## 2020-06-18 NOTE — Progress Notes (Signed)
  Subjective:  Patient ID: [name], female    DOB: 22-Jan-1956,  MRN: [id number]  Chief Complaint  Patient presents with  . Nail Problem    BL hallux BL borders x couple mo ago stared reoccuring ; 5/10 pain -pt denies redness/welling/driaange tx: none   . Plantar Fasciitis    BL PF flare up pain x few mo; 5/10 Tx: none     64 y.o. female presents with the above complaint. History confirmed with patient. Also complains of general soreness, stiffness, and numbness to the ball of both feet.  Objective:  Physical Exam: warm, good capillary refill, no trophic changes or ulcerative lesions, normal DP and PT pulses and normal sensory exam.  Painful ingrowing nail at both borders of the right, hallux; without warmth, erythema or drainage. Superficial Ingrowing nail left hallux both border  POP 3rd interspace bilat POP medial calcaneal tuber bilat  Assessment:   1. Ingrown nail   2. Plantar fasciitis   3. Morton's neuroma of both feet      Plan:  Patient was evaluated and treated and all questions answered.  Ingrown Nail, right -Patient elects to proceed with ingrown toenail removal today -Ingrown nail excised. See procedure note. -Educated on post-procedure care including soaking. Written instructions provided. -Rx for Cortisporin drops -Patient to follow up in 2 weeks for nail check.  Procedure: Excision of Ingrown Toenail Location: Right 1st toe both nail borders. Anesthesia: Lidocaine 1% plain; 1.39mL and Marcaine 0.5% plain; 1.48mL, digital block. Skin Prep: Alcohol. Dressing: Silvadene; telfa; dry, sterile, compression dressing. Technique: Following skin prep, the toe was exsanguinated and a tourniquet was secured at the base of the toe. The affected nail border was freed, split with a nail splitter, and excised. Chemical matrixectomy was then performed with phenol and irrigated out with alcohol. The tourniquet was then removed and sterile dressing  applied. Disposition: Patient tolerated procedure well. Patient to return in 2 weeks for follow-up.   Morton Neuroma -Educated on etiology -Injection delivered to the affected interspaces  Procedure: Neuroma Injection Location: Bilateral 3rd interspace Skin Prep: Alcohol. Injectate: 0.5 cc 0.5% marcaine plain, 0.5 cc dexamethasone phosphate. Disposition: Patient tolerated procedure well. Injection site dressed with a band-aid.  Plantar Fasciitis -Injection delivered to the plantar fascia of both feet.  Procedure: Injection Tendon/Ligament Consent: Verbal consent obtained. Location: Bilateral plantar fascia at the glabrous junction; medial approach. Skin Prep: Alcohol. Injectate: 1 cc 0.5% marcaine plain, 1 cc dexamethasone phosphate, 0.5 cc kenalog 10. Disposition: Patient tolerated procedure well. Injection site dressed with a band-aid.     No follow-ups on file.    MDM

## 2020-07-01 ENCOUNTER — Ambulatory Visit: Payer: Managed Care, Other (non HMO) | Admitting: Podiatry

## 2020-08-01 ENCOUNTER — Other Ambulatory Visit: Payer: Self-pay

## 2020-08-01 ENCOUNTER — Ambulatory Visit (INDEPENDENT_AMBULATORY_CARE_PROVIDER_SITE_OTHER): Payer: Managed Care, Other (non HMO)

## 2020-08-01 ENCOUNTER — Ambulatory Visit: Payer: Managed Care, Other (non HMO) | Admitting: Podiatry

## 2020-08-01 DIAGNOSIS — G5763 Lesion of plantar nerve, bilateral lower limbs: Secondary | ICD-10-CM

## 2020-08-01 DIAGNOSIS — L600 Ingrowing nail: Secondary | ICD-10-CM

## 2020-08-01 DIAGNOSIS — M722 Plantar fascial fibromatosis: Secondary | ICD-10-CM

## 2020-08-01 NOTE — Progress Notes (Signed)
  Subjective:  Patient ID: [name], female    DOB: 26-Jun-1956,  MRN: [id number]  Chief Complaint  Patient presents with  . nail check    F/U Rt hallux nail check -pt states," it's still very sore; 5/10 - no swelling or draiange but with redness   . Plantar Fasciitis    F/U BL PF Pt. states,' a little pain on the Rt but worsse on the LT foot; 6/10." no swelling - shot helped for a few days Tx: stretching  . Foot Pain    pt c/o a shocked sensation or nerve sensation at Lt midfoot x  couple wks -started after hitting foot under a desk     64 y.o. female presents with the above complaint. History confirmed with patient.  Objective:  Physical Exam: warm, good capillary refill, no trophic changes or ulcerative lesions, normal DP and PT pulses and normal sensory exam. Left Foot: point tenderness over the heel pad and tenderness between the 3rd and 4th metatarsal head  Right Foot: point tenderness over the heel pad and tenderness between the 3rd and 4th metatarsal head. Right great toe medial nail border healing well but with residual nail  No images are attached to the encounter.  Radiographs: X-ray of the left foot: no fracture, dislocation, swelling or degenerative changes noted Assessment:   1. Morton's neuroma of both feet   2. Plantar fasciitis    Plan:  Patient was evaluated and treated and all questions answered.  Morton Neuroma -Injection delivered to the affected interspaces  Procedure: Sclerosing Nerve Injection Location: Bilateral 3rd interspace Skin Prep: Alcohol. Injectate: 1.5 cc 4% sclerosing alcohol injection Disposition: Patient tolerated procedure well. Injection site dressed with a band-aid.   Plantar Fasciitis -Injection delivered to the plantar fascia of both feet.  Procedure: Injection Tendon/Ligament Consent: Verbal consent obtained. Location: Bilateral plantar fascia at the glabrous junction; medial approach. Skin Prep: Alcohol. Injectate:  1 cc 0.5% marcaine plain, 1 cc dexamethasone phosphate, 0.5 cc kenalog 10. Disposition: Patient tolerated procedure well. Injection site dressed with a band-aid.  Right Ingrown Nail -Nail debrided in slant-back fashion.  No follow-ups on file.

## 2020-09-02 ENCOUNTER — Telehealth (HOSPITAL_COMMUNITY): Payer: Self-pay | Admitting: Oncology

## 2020-09-02 ENCOUNTER — Ambulatory Visit (HOSPITAL_COMMUNITY)
Admission: RE | Admit: 2020-09-02 | Discharge: 2020-09-02 | Disposition: A | Discharge status: Home / Self Care | Payer: Managed Care, Other (non HMO) | Source: Ambulatory Visit | Attending: Pulmonary Disease | Admitting: Pulmonary Disease

## 2020-09-02 ENCOUNTER — Other Ambulatory Visit (HOSPITAL_COMMUNITY): Payer: Self-pay | Admitting: Oncology

## 2020-09-02 DIAGNOSIS — U071 COVID-19: Secondary | ICD-10-CM

## 2020-09-02 MED ORDER — METHYLPREDNISOLONE SODIUM SUCC 125 MG IJ SOLR: 125.0000 mg | Freq: Once | INTRAMUSCULAR | Status: DC | PRN

## 2020-09-02 MED ORDER — ALBUTEROL SULFATE HFA 108 (90 BASE) MCG/ACT IN AERS: 2.0000 | INHALATION_SPRAY | Freq: Once | RESPIRATORY_TRACT | Status: DC | PRN

## 2020-09-02 MED ORDER — SODIUM CHLORIDE 0.9 % IV SOLN
1200.0000 mg | Freq: Once | INTRAVENOUS | Status: AC
  Administered 2020-09-02: 1200 mg via INTRAVENOUS
  Filled 2020-09-02: qty 10

## 2020-09-02 MED ORDER — SODIUM CHLORIDE 0.9 % IV SOLN: INTRAVENOUS | Status: DC | PRN

## 2020-09-02 MED ORDER — DIPHENHYDRAMINE HCL 50 MG/ML IJ SOLN: 50.0000 mg | Freq: Once | INTRAMUSCULAR | Status: DC | PRN

## 2020-09-02 MED ORDER — EPINEPHRINE 0.3 MG/0.3ML IJ SOAJ: 0.3000 mg | Freq: Once | INTRAMUSCULAR | Status: DC | PRN

## 2020-09-02 MED ORDER — FAMOTIDINE IN NACL 20-0.9 MG/50ML-% IV SOLN: 20.0000 mg | Freq: Once | INTRAVENOUS | Status: DC | PRN

## 2020-09-02 NOTE — Telephone Encounter (Signed)
I connected by phone with [name] to discuss the potential use of an new treatment for mild to moderate COVID-19 viral infection in non-hospitalized patients.   This patient is a age/sex that meets the FDA criteria for Emergency Use Authorization of casirivimab\imdevimab.  Has a (+) direct SARS-CoV-2 viral test result 1. Has mild or moderate COVID-19  2. Is ? 62 years of age and weighs ? 40 kg 3. Is NOT hospitalized due to COVID-19 4. Is NOT requiring oxygen therapy or requiring an increase in baseline oxygen flow rate due to COVID-19 5. Is within 10 days of symptom onset 6. Has at least one of the high risk factor(s) for progression to severe COVID-19 and/or hospitalization as defined in EUA. Specific high risk criteria :  Past Medical History:  Diagnosis Date  . Arthritis    osteo  . Asthma   . Benign meningioma ([place]) 05-12-13   dx. '98- small, no problems  . Bronchitis 05-12-13   occ. bouts with bronchitis  . Cancer ([place]) 02/2010   left breast cancer  . History of cancer chemotherapy   . History of frequent urinary tract infections    last tx. 1 month ago  . PONV (postoperative nausea and vomiting)   . Recurrent sinus infections    last  2 months ago  . Wears partial dentures    partials top and bottom  ?  ?    Symptom onset  08/24/2020   I have spoken and communicated the following to the patient or parent/caregiver:   1. FDA has authorized the emergency use of casirivimab\imdevimab for the treatment of mild to moderate COVID-19 in adults and pediatric patients with positive results of direct SARS-CoV-2 viral testing who are 5 years of age and older weighing at least 40 kg, and who are at high risk for progressing to severe COVID-19 and/or hospitalization.   2. The significant known and potential risks and benefits of casirivimab\imdevimab, and the extent to which such potential risks and benefits are unknown.   3. Information on available alternative treatments and the  risks and benefits of those alternatives, including clinical trials.   4. Patients treated with casirivimab\imdevimab should continue to self-isolate and use infection control measures (e.g., wear mask, isolate, social distance, avoid sharing personal items, clean and disinfect "high touch" surfaces, and frequent handwashing) according to CDC guidelines.    5. The patient or parent/caregiver has the option to accept or refuse casirivimab\imdevimab .   After reviewing this information with the patient, The patient agreed to proceed with receiving casirivimab\imdevimab infusion and will be provided a copy of the Fact sheet prior to receiving the infusion.[name], AGNP-C [phone] ([place])

## 2020-09-02 NOTE — Progress Notes (Signed)
  Diagnosis: COVID-19  Physician: Patrick Wright  Procedure: [facility] Med: casirivimab\imdevimab infusion - Provided patient with casirivimab\imdevimab fact sheet for patients, parents and caregivers prior to infusion.  Complications: No immediate complications noted.  Discharge: Discharged home   Katalyna Socarras L 09/02/2020  

## 2020-09-02 NOTE — Discharge Instructions (Signed)

## 2020-09-03 ENCOUNTER — Telehealth: Payer: Self-pay | Admitting: Oncology

## 2020-09-05 ENCOUNTER — Ambulatory Visit: Payer: Managed Care, Other (non HMO) | Admitting: Podiatry

## 2020-10-04 DIAGNOSIS — Z170 Estrogen receptor positive status [ER+]: Secondary | ICD-10-CM | POA: Diagnosis not present

## 2020-10-04 DIAGNOSIS — C50812 Malignant neoplasm of overlapping sites of left female breast: Secondary | ICD-10-CM | POA: Diagnosis not present

## 2020-10-24 ENCOUNTER — Ambulatory Visit (INDEPENDENT_AMBULATORY_CARE_PROVIDER_SITE_OTHER): Payer: Managed Care, Other (non HMO)

## 2020-10-24 ENCOUNTER — Ambulatory Visit (INDEPENDENT_AMBULATORY_CARE_PROVIDER_SITE_OTHER): Payer: Managed Care, Other (non HMO) | Admitting: Orthopaedic Surgery

## 2020-10-24 ENCOUNTER — Encounter: Payer: Self-pay | Admitting: Orthopaedic Surgery

## 2020-10-24 DIAGNOSIS — M25551 Pain in right hip: Secondary | ICD-10-CM | POA: Diagnosis not present

## 2020-10-24 DIAGNOSIS — M542 Cervicalgia: Secondary | ICD-10-CM | POA: Diagnosis not present

## 2020-10-24 MED ORDER — METHYLPREDNISOLONE ACETATE 40 MG/ML IJ SUSP
40.0000 mg | INTRAMUSCULAR | Status: AC | PRN
  Administered 2020-10-24: 40 mg via INTRA_ARTICULAR

## 2020-10-24 MED ORDER — LIDOCAINE HCL 1 % IJ SOLN
3.0000 mL | INTRAMUSCULAR | Status: AC | PRN
  Administered 2020-10-24: 3 mL

## 2020-10-24 NOTE — Progress Notes (Signed)
Office Visit Note   Patient: [name]           Date of Birth: 1956-02-10           MRN: [id number] Visit Date: 10/24/2020              Requested by: Imagene Riches, NP [address] [place],  [place] [address] PCP: Imagene Riches, NP   Assessment & Plan: Visit Diagnoses:  1. Pain in right hip   2. Neck pain     Plan: I did provide a steroid injection over her right hip trochanteric area which she tolerated well.  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  She agrees with this referral as well.  All questions and concerns were answered and addressed.  From my standpoint follow-up will be as needed.  Follow-Up Instructions: Return if symptoms worsen or fail to improve.   Orders:  Orders Placed This Encounter  Procedures  . Large Joint Inj  . XR HIP UNILAT W OR W/O PELVIS 2-3 VIEWS RIGHT  . XR Cervical Spine 2 or 3 views   No orders of the defined types were placed in this encounter.     Procedures: Large Joint Inj: R greater trochanter on 10/24/2020 3:07 PM Indications: pain and diagnostic evaluation Details: 22 G 1.5 in needle, lateral approach  Arthrogram: No  Medications: 3 mL lidocaine 1 %; 40 mg methylPREDNISolone acetate 40 MG/ML Outcome: tolerated well, no immediate complications Procedure, treatment alternatives, risks and benefits explained, specific risks discussed. Consent was given by the patient. Immediately prior to procedure a time out was called to verify the correct patient, procedure, equipment, support staff and site/side marked as required. Patient was prepped and draped in the usual sterile fashion.       Clinical Data: No additional findings.   Subjective: Chief Complaint  Patient presents with  . Right Hip - Pain  . Neck - Pain  The patient is well-known to me.  We  replaced both hips well over 5 years ago.  Her right hip still hurts on occasion but it is over the trochanteric area where it hurts the most.  We have injected this area before and is helped.  She is also been having some neck pain.  Last year we x-rayed her cervical spine and showed a significant anterolisthesis of C3 on C4.  Other than neck pain she has been asymptomatic in terms of any radicular symptoms of her upper extremities.  She does feel like her neck pain is slowly getting worse.  She still denies any radicular symptoms of the upper extremities.  She reports her left hip is normal in her right hip hurts mainly with activities.  She has had no other acute change in medical status. HPI  Review of Systems She currently denies any headache, chest pain, shortness of breath, fever, chills, nausea, vomiting  Objective: Vital Signs: There were no vitals taken for this visit.  Physical Exam She is alert and orient x3 and in no acute distress.  She does not walk with a limp. Ortho Exam Examination of both hips show the move smoothly and fluidly.  There is more significant pain over the trochanteric area on the right hip.  Her leg lengths are equal.  She has excellent strength in her bilateral  lower extremities and no pain in the groin on range of motion of either hip.  Examination of both upper extremity shows good range of motion with normal motor and sensory exam.  She does have pain with flexion extension and rotation of the cervical spine. Specialty Comments:  No specialty comments available.  Imaging: XR HIP UNILAT W OR W/O PELVIS 2-3 VIEWS RIGHT  Result Date: 10/24/2020 An AP pelvis and lateral right hip shows well-seated bilateral total hip arthroplasties with no evidence of loosening or complicating features.  XR Cervical Spine 2 or 3 views  Result Date: 10/24/2020 2 views of the cervical spine show significant anterolisthesis of C3 on C4.  This is worsening from films compared to  last year slightly.  There is also profound degenerative changes at multiple levels of the cervical spine.    PMFS History: Patient Active Problem List   Diagnosis Date Noted  . History of bilateral total hip arthroplasty 03/29/2019  . Mild intermittent asthma without complication 33/82/5053  . Asthma, well controlled 03/27/2018  . LPRD (laryngopharyngeal reflux disease) 03/27/2018  . Other allergic rhinitis 03/27/2018  . Chronic pansinusitis 03/27/2018  . Trochanteric bursitis, right hip 03/23/2018  . Acute right-sided low back pain with right-sided sciatica 03/23/2018  . Plantar fasciitis 08/03/2017  . Allergic rhinitis 09/18/2014  . Reactive airway disease 09/03/2014  . Personal history of breast cancer 03/27/2014  . Status post left breast reconstruction 03/27/2014  . Status post right breast implant 03/27/2014  . 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 Arthritis pain of hip 07/07/2013  . Postoperative anemia due to acute blood loss 05/28/2013    Class: Acute  . Acute bronchitis 05/27/2013  . Acute maxillary sinusitis 05/27/2013  . Anxiety state 05/27/2013  . Depressive disorder 05/27/2013  . Encounter for long-term (current) use of other medications 05/27/2013  . Malignant neoplasm of breast (female) ([place]) 05/27/2013  . Osteoarthrosis 05/27/2013  . Other specified disorders of nose and nasal sinuses 05/27/2013  . Pain in joints 05/27/2013  . Paronychia of finger 05/27/2013  . Wheezing 05/27/2013  . Low back pain 05/27/2013  . Sprain and strain of foot 05/27/2013  . Cellulitis of unspecified finger 05/27/2013  . Degenerative arthritis of hip 05/26/2013  . Acquired absence of breast and absent nipple 07/08/2012   Past Medical History:  Diagnosis Date  . Arthritis    osteo  . Asthma   . Benign meningioma ([place] [facility]) 05-12-13   dx. '98- small, no problems  . Bronchitis 05-12-13   occ. bouts with bronchitis  . Cancer ([facility]) 02/2010   left breast cancer  .  History of cancer chemotherapy   . History of frequent urinary tract infections    last tx. 1 month ago  . PONV (postoperative nausea and vomiting)   . Recurrent sinus infections    last  2 months ago  . Wears partial dentures    partials top and bottom    Family History  Problem Relation Age of Onset  . Colon cancer Mother   . High blood pressure Sister   . High blood pressure Sister     Past Surgical History:  Procedure Laterality Date  . back kyoplasty  2017  . BREAST RECONSTRUCTION WITH PLACEMENT OF TISSUE EXPANDER AND FLEX HD (ACELLULAR HYDRATED DERMIS) Left 12/28/2013   Procedure: BREAST RECONSTRUCTION WITH PLACEMENT OF TISSUE EXPANDER AND FLEX HD TO LEFT BREAST  (ACELLULAR HYDRATED DERMIS);  Surgeon: Theodoro Kos, DO;  Location: [facility];  Service: Clinical cytogeneticist;  Laterality: Left;  . BREAST SURGERY Left 2011   '11- left breast mastectomy  . CHOLECYSTECTOMY    . MASTOPEXY Right 03/15/2014   Procedure: RIGHT BREAST MASTOPEXY;  Surgeon: Theodoro Kos, DO;  Location: [facility];  Service: Plastics;  Laterality: Right;  . port-acath  05-12-13   insertion and removal  . PUNCH BIOPSY OF SKIN Right 03/15/2014   Procedure: PUNCH BIOPSY OF SKIN x2 right chest and midline chest;  Surgeon: Theodoro Kos, DO;  Location: [facility];  Service: Plastics;  Laterality: Right;  . REMOVAL OF BILATERAL TISSUE EXPANDERS WITH PLACEMENT OF BILATERAL BREAST IMPLANTS Bilateral 03/15/2014   Procedure: REMOVAL OF LEFT  TISSUE EXPANDERS WITH PLACEMENT OF BILATERAL BREAST IMPLANTS ;  Surgeon: Theodoro Kos, DO;  Location: [facility];  Service: Plastics;  Laterality: Bilateral;  . SINOSCOPY  04/2017  . TOTAL HIP ARTHROPLASTY Left 05/26/2013   Procedure: Left TOTAL HIP ARTHROPLASTY ANTERIOR APPROACH;  Surgeon: Mcarthur Rossetti, MD;  Location: [facility];  Service: Orthopedics;  Laterality: Left;  . TOTAL HIP ARTHROPLASTY Right 07/07/2013   Procedure:  RIGHT TOTAL HIP ARTHROPLASTY ANTERIOR APPROACH;  Surgeon: Mcarthur Rossetti, MD;  Location: [facility];  Service: Orthopedics;  Laterality: Right;  . TUBAL LIGATION     Social History   Occupational History  . Not on file  Tobacco Use  . Smoking status: Former Smoker    Packs/day: 1.00    Years: 20.00    Pack years: 20.00    Types: Cigarettes    Quit date: 05/12/2010    Years since quitting: 10.4  . Smokeless tobacco: Never Used  Vaping Use  . Vaping Use: Never used  Substance and Sexual Activity  . Alcohol use: Yes    Comment: social  . Drug use: No  . Sexual activity: Not Currently

## 2020-10-24 NOTE — Addendum Note (Signed)
Addended by: Robyne Peers on: 10/24/2020 03:58 PM   Modules accepted: Orders

## 2020-10-31 ENCOUNTER — Ambulatory Visit: Payer: Managed Care, Other (non HMO) | Admitting: Vascular Surgery

## 2020-11-07 ENCOUNTER — Ambulatory Visit (HOSPITAL_COMMUNITY)
Admission: RE | Admit: 2020-11-07 | Discharge: 2020-11-07 | Disposition: A | Discharge status: Home / Self Care | Payer: Managed Care, Other (non HMO) | Source: Ambulatory Visit | Attending: Vascular Surgery | Admitting: Vascular Surgery

## 2020-11-07 ENCOUNTER — Other Ambulatory Visit: Payer: Self-pay

## 2020-11-07 ENCOUNTER — Encounter: Payer: Self-pay | Admitting: Vascular Surgery

## 2020-11-07 ENCOUNTER — Other Ambulatory Visit (HOSPITAL_COMMUNITY): Payer: Self-pay | Admitting: Vascular Surgery

## 2020-11-07 ENCOUNTER — Ambulatory Visit (INDEPENDENT_AMBULATORY_CARE_PROVIDER_SITE_OTHER): Payer: Managed Care, Other (non HMO) | Admitting: Vascular Surgery

## 2020-11-07 VITALS — BP 131/80 | HR 69 | Temp 97.4°F | Resp 20 | Ht 66.0 in | Wt 154.0 lb

## 2020-11-07 DIAGNOSIS — I6521 Occlusion and stenosis of right carotid artery: Secondary | ICD-10-CM

## 2020-11-07 DIAGNOSIS — I728 Aneurysm of other specified arteries: Secondary | ICD-10-CM

## 2020-11-07 DIAGNOSIS — I739 Peripheral vascular disease, unspecified: Secondary | ICD-10-CM

## 2020-11-07 NOTE — Progress Notes (Signed)
Referring Physician: Heide Scales  Patient name: [name] MRN: [id number] DOB: 05/21/1956 Sex: female  REASON FOR CONSULT: Splenic artery aneurysm  HPI: [name] is a 64 y.o. female, with an incidental finding of a splenic artery aneurysm found on a recent CT scan after a fall off of a stair climber machine.  Patient has no family history of aneurysms.  We have seen her in the past for asymptomatic right carotid stenosis.  She has no symptoms of TIA amaurosis or stroke.  She currently has no abdominal pain.  She is still sore with some rib fractures on the left side.   Past Medical History:  Diagnosis Date  . Arthritis    osteo  . Asthma   . Benign meningioma ([place]) 05-12-13   dx. '98- small, no problems  . Bronchitis 05-12-13   occ. bouts with bronchitis  . Cancer ([facility]) 02/2010   left breast cancer  . History of cancer chemotherapy   . History of frequent urinary tract infections    last tx. 1 month ago  . PONV (postoperative nausea and vomiting)   . Recurrent sinus infections    last  2 months ago  . Wears partial dentures    partials top and bottom   Past Surgical History:  Procedure Laterality Date  . back kyoplasty  2017  . BREAST RECONSTRUCTION WITH PLACEMENT OF TISSUE EXPANDER AND FLEX HD (ACELLULAR HYDRATED DERMIS) Left 12/28/2013   Procedure: BREAST RECONSTRUCTION WITH PLACEMENT OF TISSUE EXPANDER AND FLEX HD TO LEFT BREAST  (ACELLULAR HYDRATED DERMIS);  Surgeon: Theodoro Kos, DO;  Location: [facility];  Service: Plastics;  Laterality: Left;  . BREAST SURGERY Left 2011   '11- left breast mastectomy  . CHOLECYSTECTOMY    . MASTOPEXY Right 03/15/2014   Procedure: RIGHT BREAST MASTOPEXY;  Surgeon: Theodoro Kos, DO;  Location: [facility];  Service: Plastics;  Laterality: Right;  . port-acath  05-12-13   insertion and removal  . PUNCH BIOPSY OF SKIN Right 03/15/2014   Procedure: PUNCH BIOPSY OF SKIN x2 right chest and  midline chest;  Surgeon: Theodoro Kos, DO;  Location: [facility];  Service: Plastics;  Laterality: Right;  . REMOVAL OF BILATERAL TISSUE EXPANDERS WITH PLACEMENT OF BILATERAL BREAST IMPLANTS Bilateral 03/15/2014   Procedure: REMOVAL OF LEFT  TISSUE EXPANDERS WITH PLACEMENT OF BILATERAL BREAST IMPLANTS ;  Surgeon: Theodoro Kos, DO;  Location: [facility];  Service: Plastics;  Laterality: Bilateral;  . SINOSCOPY  04/2017  . TOTAL HIP ARTHROPLASTY Left 05/26/2013   Procedure: Left TOTAL HIP ARTHROPLASTY ANTERIOR APPROACH;  Surgeon: Mcarthur Rossetti, MD;  [facility];  Service: Orthopedics;  Laterality: Left;  . TOTAL HIP ARTHROPLASTY Right 07/07/2013   Procedure: RIGHT TOTAL HIP ARTHROPLASTY ANTERIOR APPROACH;  Surgeon: Mcarthur Rossetti, MD;  [facility];  Service: Orthopedics;  Laterality: Right;  . TUBAL LIGATION      Family History  Problem Relation Age of Onset  . Colon cancer Mother   . High blood pressure Sister   . High blood pressure Sister     SOCIAL HISTORY: Social History   Socioeconomic History  . Marital status: Single    Spouse name: Not on file  . Number of children: Not on file  . Years of education: Not on file  . Highest education level: Not on file  Occupational History  . Not on file  Tobacco Use  . Smoking status: Former Smoker  Packs/day: 1.00    Years: 20.00    Pack years: 20.00    Types: Cigarettes    Quit date: 05/12/2010    Years since quitting: 10.4  . Smokeless tobacco: Never Used  Vaping Use  . Vaping Use: Never used  Substance and Sexual Activity  . Alcohol use: Yes    Comment: social  . Drug use: No  . Sexual activity: Not Currently  Other Topics Concern  . Not on file  Social History Narrative  . Not on file   Social Determinants of Health   Financial Resource Strain:   . Difficulty of Paying Living Expenses: Not on file  Food Insecurity:   . Worried About Charity fundraiser in the  Last Year: Not on file  . Ran Out of Food in the Last Year: Not on file  Transportation Needs:   . Lack of Transportation (Medical): Not on file  . Lack of Transportation (Non-Medical): Not on file  Physical Activity:   . Days of Exercise per Week: Not on file  . Minutes of Exercise per Session: Not on file  Stress:   . Feeling of Stress : Not on file  Social Connections:   . Frequency of Communication with Friends and Family: Not on file  . Frequency of Social Gatherings with Friends and Family: Not on file  . Attends Religious Services: Not on file  . Active Member of Clubs or Organizations: Not on file  . Attends Archivist Meetings: Not on file  . Marital Status: Not on file  Intimate Partner Violence:   . Fear of Current or Ex-Partner: Not on file  . Emotionally Abused: Not on file  . Physically Abused: Not on file  . Sexually Abused: Not on file    Allergies  Allergen Reactions  . Codeine Nausea Only  . Meperidine Nausea Only  . Other     "narcotics" causes nausea and vomiting    Current Outpatient Medications  Medication Sig Dispense Refill  . albuterol (PROAIR HFA) 108 (90 Base) MCG/ACT inhaler Inhale into the lungs.    Marland Kitchen alendronate (FOSAMAX) 70 MG tablet Take 70 mg by mouth every Sunday. Take with a full glass of water on an empty stomach.    Marland Kitchen aspirin EC 81 MG tablet Take 1 tablet (81 mg total) by mouth daily. 150 tablet 2  . Calcium Carbonate-Vitamin D (CALCIUM-D) 600-400 MG-UNIT TABS Take 1 tablet by mouth 2 (two) times daily.    Marland Kitchen gabapentin (NEURONTIN) 300 MG capsule Take by mouth.    . gatifloxacin (ZYMAXID) 0.5 % SOLN     . ibuprofen (ADVIL) 800 MG tablet Take 800 mg by mouth 3 (three) times daily.    . meclizine (ANTIVERT) 25 MG tablet Take by mouth.    . Melatonin 10 MG TABS Take 2 tablets by mouth at bedtime.    . meloxicam (MOBIC) 15 MG tablet Take 1 tablet (15 mg total) by mouth daily. 30 tablet 0  . methocarbamol (ROBAXIN) 500 MG tablet  Take 1 tablet (500 mg total) by mouth 3 (three) times daily. 40 tablet 0  . Multiple Vitamin (MULTIVITAMIN WITH MINERALS) TABS Take 1 tablet by mouth daily.    Marland Kitchen neomycin-polymyxin-hydrocortisone (CORTISPORIN) OTIC solution Apply 2-3 drops to the ingrown toenail site twice daily. Cover with band-aid. 10 mL 0  . Omega-3 Fatty Acids (FISH OIL) 500 MG CAPS Take 1 capsule by mouth daily.    Marland Kitchen omeprazole (PRILOSEC) 40 MG capsule Take 1  capsule (40 mg total) by mouth daily. 30 capsule 5  . orphenadrine (NORFLEX) 100 MG tablet SMARTSIG:1 Tablet(s) By Mouth Every 12 Hours PRN    . pantoprazole (PROTONIX) 40 MG tablet Take 40 mg by mouth every morning.    Vladimir Faster Glycol-Propyl Glycol (SYSTANE OP) Apply to eye.    . predniSONE (STERAPRED UNI-PAK 21 TAB) 10 MG (21) TBPK tablet 12 day dose pack follow directions on pack    . PROLENSA 0.07 % SOLN Place 1 drop into the left eye at bedtime.    . rosuvastatin (CRESTOR) 10 MG tablet Take 1 tablet (10 mg total) by mouth daily. 30 tablet 12  . terbinafine (LAMISIL) 250 MG tablet     . triamcinolone (NASACORT ALLERGY 24HR) 55 MCG/ACT AERO nasal inhaler Place 1 spray into the nose daily.    . Turmeric Curcumin 500 MG CAPS Take 1 tablet by mouth daily.    . vitamin C (ASCORBIC ACID) 500 MG tablet Take 500 mg by mouth daily.    . Vitamins/Minerals TABS Take by mouth.    . Carbinoxamine Maleate 6 MG TABS Take 1 tablet by mouth every 8 (eight) hours as needed. (Patient not taking: Reported on 05/29/2020) 90 tablet 3  . cephALEXin (KEFLEX) 500 MG capsule Take 1 capsule (500 mg total) by mouth 3 (three) times daily. (Patient not taking: Reported on 05/29/2020) 30 capsule 0  . ciclopirox (PENLAC) 8 % solution Apply topically at bedtime. Apply over nail and surrounding skin. Apply daily over previous coat. Remove weekly with file or polish remover. (Patient not taking: Reported on 05/29/2020) 6.6 mL 0  . HYDROcodone-acetaminophen (NORCO/VICODIN) 5-325 MG tablet Take 1 tablet by  mouth 4 (four) times daily as needed. (Patient not taking: Reported on 11/07/2020)    . ipratropium (ATROVENT) 0.06 % nasal spray Place 2 sprays into both nostrils every 6 (six) hours as needed for rhinitis. (Patient not taking: Reported on 05/29/2020) 15 mL 5  . montelukast (SINGULAIR) 10 MG tablet Take 1 tablet (10 mg total) by mouth at bedtime. (Patient not taking: Reported on 05/29/2020) 30 tablet 5  . mupirocin ointment (BACTROBAN) 2 % Apply 1 application topically 2 (two) times daily. (Patient not taking: Reported on 05/29/2020) 30 g 2  . neomycin-polymyxin-hydrocortisone (CORTISPORIN) OTIC solution Apply 2-3 drops to the ingrown toenail site twice daily. Cover with band-aid. (Patient not taking: Reported on 05/29/2020) 10 mL 0  . ranitidine (ZANTAC) 300 MG tablet Take 1 tablet (300 mg total) by mouth at bedtime. (Patient not taking: Reported on 05/29/2020) 30 tablet 5   No current facility-administered medications for this visit.    ROS:   General:  No weight loss, Fever, chills  HEENT: No recent headaches, no nasal bleeding, no visual changes, no sore throat  Neurologic: No dizziness, blackouts, seizures. No recent symptoms of stroke or mini- stroke. No recent episodes of slurred speech, or temporary blindness.  Cardiac: No recent episodes of chest pain/pressure, no shortness of breath at rest.  No shortness of breath with exertion.  Denies history of atrial fibrillation or irregular heartbeat  Vascular: No history of rest pain in feet.  No history of claudication.  No history of non-healing ulcer, No history of DVT   Pulmonary: No home oxygen, no productive cough, no hemoptysis,  No asthma or wheezing  Musculoskeletal:  [ ]  Arthritis, [ ]  Low back pain,  [ ]  Joint pain  Hematologic:No history of hypercoagulable state.  No history of easy bleeding.  No history of  anemia  Gastrointestinal: No hematochezia or melena,  No gastroesophageal reflux, no trouble swallowing  Urinary: [ ]  chronic  Kidney disease, [ ]  on HD - [ ]  MWF or [ ]  TTHS, [ ]  Burning with urination, [ ]  Frequent urination, [ ]  Difficulty urinating;   Skin: No rashes  Psychological: No history of anxiety,  No history of depression   Physical Examination  Vitals:   11/07/20 1422  BP: 131/80  Pulse: 69  Resp: 20  Temp: (!) 97.4 F (36.3 C)  SpO2: 97%  Weight: 154 lb (69.9 kg)  Height: 5\' 6"  (1.676 m)    Body mass index is 24.86 kg/m.  General:  Alert and oriented, no acute distress HEENT: Normal Neck: No JVD Cardiac: Regular Rate and Rhythm Abdomen: Soft, non-tender, non-distended, no mass Skin: No rash Extremity Pulses:  2+ radial, brachial, femoral, dorsalis pedis, posterior tibial pulses bilaterally Musculoskeletal: No deformity or edema  Neurologic: Upper and lower extremity motor 5/5 and symmetric  DATA:  I reviewed the images of the patient's recent CT scan of the chest which shows a 1.3 cm heavily calcified splenic artery aneurysm no evidence of rupture  Since the patient was due for a surveillance carotid ultrasound in December we went ahead and did that today.  This showed no significant carotid stenosis.  She does have some atherosclerotic plaque and calcification of the carotids bilaterally.  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  I do not believe she needs a follow-up carotid ultrasound unless she develops symptoms in the future as currently she has no significant flow-limiting stenosis.  Continued atherosclerotic risk factor modification.   PLAN: See above   Ruta Hinds, MD [facility] Office: [phone]

## 2021-01-23 NOTE — Telephone Encounter (Signed)
error 

## 2021-02-13 ENCOUNTER — Other Ambulatory Visit: Payer: Self-pay

## 2021-02-13 ENCOUNTER — Ambulatory Visit (INDEPENDENT_AMBULATORY_CARE_PROVIDER_SITE_OTHER): Payer: Managed Care, Other (non HMO)

## 2021-02-13 ENCOUNTER — Telehealth: Payer: Self-pay | Admitting: Orthopaedic Surgery

## 2021-02-13 ENCOUNTER — Ambulatory Visit (INDEPENDENT_AMBULATORY_CARE_PROVIDER_SITE_OTHER): Payer: Managed Care, Other (non HMO) | Admitting: Orthopaedic Surgery

## 2021-02-13 DIAGNOSIS — M5441 Lumbago with sciatica, right side: Secondary | ICD-10-CM | POA: Diagnosis not present

## 2021-02-13 DIAGNOSIS — G8929 Other chronic pain: Secondary | ICD-10-CM

## 2021-02-13 MED ORDER — METHOCARBAMOL 750 MG PO TABS: 750.0000 mg | ORAL_TABLET | Freq: Four times a day (QID) | ORAL | 1 refills | Status: AC | PRN

## 2021-02-13 NOTE — Progress Notes (Signed)
The patient is well-known to me.  She comes in with significant right-sided low back pain that radiates into her right hip area.  I replaced both of her hips.  She had a fall back in November off of some exercise equipment injuring a few ribs with some fractures and a sternal fracture.  She has had significant pain in her back since then more to the right than the left is more activity related.  It does not seem to radiate down the leg but it does radiate into the hip.  I did x-ray both her hips in November which were normal on x-ray.  On exam today she has normal hip exam.  Her pain seems to be all across the lower lumbar spine and to the right side as well.  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  There is a previous cement seen in the upper lumbar spine from previous vertebroplasty.  The overall alignment is worsened when compared to previous films over the years of her lumbar spine.  At this point I would like to obtain MRI of the lumbar spine to rule out a new fracture as well as assess for any nerve impingement.  I will have her try Robaxin-750 milligrams as needed for pain and spasms.  We will see her back after this MRI.  All question concerns were answered and addressed.

## 2021-02-13 NOTE — Telephone Encounter (Signed)
Pt would like to know if there's a facility in either [facility] or [place] that could do her MRI? Pt would like a CB with an update  [phone]

## 2021-02-14 NOTE — Telephone Encounter (Signed)
I called pt and left vm stating im sending her order to [facility] and someone there will be calling to schedule with her

## 2021-03-03 ENCOUNTER — Ambulatory Visit: Payer: Managed Care, Other (non HMO) | Admitting: Podiatry

## 2021-03-03 ENCOUNTER — Encounter: Payer: Self-pay | Admitting: Orthopaedic Surgery

## 2021-03-08 ENCOUNTER — Other Ambulatory Visit (HOSPITAL_BASED_OUTPATIENT_CLINIC_OR_DEPARTMENT_OTHER): Payer: Managed Care, Other (non HMO)

## 2021-03-13 ENCOUNTER — Ambulatory Visit: Payer: Managed Care, Other (non HMO) | Admitting: Podiatry

## 2021-03-13 ENCOUNTER — Encounter: Payer: Self-pay | Admitting: Podiatry

## 2021-03-13 ENCOUNTER — Other Ambulatory Visit: Payer: Self-pay

## 2021-03-13 DIAGNOSIS — L600 Ingrowing nail: Secondary | ICD-10-CM

## 2021-03-13 NOTE — Patient Instructions (Signed)

## 2021-03-17 ENCOUNTER — Ambulatory Visit: Payer: Managed Care, Other (non HMO) | Admitting: Orthopaedic Surgery

## 2021-03-17 ENCOUNTER — Other Ambulatory Visit: Payer: Self-pay

## 2021-03-17 ENCOUNTER — Encounter: Payer: Self-pay | Admitting: Orthopaedic Surgery

## 2021-03-17 DIAGNOSIS — G8929 Other chronic pain: Secondary | ICD-10-CM | POA: Diagnosis not present

## 2021-03-17 DIAGNOSIS — M5441 Lumbago with sciatica, right side: Secondary | ICD-10-CM | POA: Diagnosis not present

## 2021-03-17 NOTE — Progress Notes (Signed)
The patient comes in today to go over an MRI of her lumbar spine.  She has been having right-sided low back pain with sciatica as well on the right side.  She does have a history of a T12 compression fracture that required kyphoplasty.  That has been stable.  We have tried conservative treatment and with the failure that center for the MRI of her lumbar spine.  Most of her pain is to the right side of the low back and his sciatic region.  This has been getting worse for her on the right side.  She still denies any change in bowel bladder function.  She is a very healthy 65 year old female.  On exam she has a positive straight leg raise to the right side.  She hurts along the lower aspect of her lumbar spine to the right side only with some pain and sciatic region.  The MRI of her lumbar spine shows her previous scoliosis and multilevel degenerative changes.  However at L5-S1 there is significant facet arthropathy combined with a far lateral disc bulge that could be affecting the L5 nerve root.  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  Given the MRI findings and her clinical exam findings, the neck step would be sending her to Dr. Ernestina Patches for a right-sided epidural steroid injection at L5 and S1.  She agrees with this treatment plan.  We will work on getting that set up.

## 2021-03-21 NOTE — Progress Notes (Signed)
  Subjective:  Patient ID: [name], female    DOB: 1956/09/07,  MRN: [id number]  Chief Complaint  Patient presents with  . Plantar Fasciitis    Heels are somewhat better  . Nail Problem    Nails are still an issue   65 y.o. female presents with the above complaint. History confirmed with patient.  No longer having problems with the heels or forefoot.  Only having pain at the right big toe  Objective:  Physical Exam: warm, good capillary refill, no trophic changes or ulcerative lesions, normal DP and PT pulses and normal sensory exam.  Small ingrown nail to the right great toe both borders Assessment:   1. Ingrown nail    Plan:  Patient was evaluated and treated and all questions answered.  Right Ingrown Nail -Nail again debrided and slant back fashion to patient relief  Return if symptoms worsen or fail to improve.

## 2021-03-24 ENCOUNTER — Telehealth: Payer: Self-pay

## 2021-03-24 NOTE — Telephone Encounter (Signed)
Patient called to ask if she should still be taking aspirin. Advised her this was fine due to her risk of stroke - Dr. Oneida Alar has note taken her off of it. Patient verbalizes understanding.

## 2021-03-25 ENCOUNTER — Other Ambulatory Visit: Payer: Self-pay | Admitting: Vascular Surgery

## 2021-05-16 ENCOUNTER — Telehealth: Payer: Self-pay

## 2021-05-16 NOTE — Telephone Encounter (Signed)
Patient called she stated she has new insurance that is [place]ON with [place] she is requesting a medical records release form to be faxed to her fax:[phone] call back:[phone]

## 2021-05-20 ENCOUNTER — Telehealth: Payer: Self-pay | Admitting: Physical Medicine and Rehabilitation

## 2021-05-20 NOTE — Telephone Encounter (Signed)
Pt called stating Dr. Ninfa Linden had referred her to see Dr.Newton but when she was called it wasn't good time for her so she needed to see if she could go ahead and get that appt scheduled now.   [phone]

## 2021-05-20 NOTE — Telephone Encounter (Signed)
Pt called stating she hasn't received the faxed form and would like to have it sent over right away so she can sign and her her records please. Pt would like a CB as soon as the release form has been faxed since she has been waiting since 05/16/21.   F# [phone] CB# [phone]

## 2021-05-21 NOTE — Telephone Encounter (Signed)
Called pt and LVM #1 

## 2021-05-27 ENCOUNTER — Telehealth: Payer: Self-pay | Admitting: Physical Medicine and Rehabilitation

## 2021-05-27 NOTE — Telephone Encounter (Signed)
Pt called stating she missed a call from [facility] and would like to be tried again.   [phone]

## 2021-05-27 NOTE — Telephone Encounter (Signed)
Records faxed 6/1. Patient already aware out of network

## 2021-05-27 NOTE — Telephone Encounter (Signed)
Spoke with pt and I had to inform her that her insurance is Out-Of-Network. Pt has if Dr. Renard Hamper could send her medical records to her Dr. [facility] [place] [facility]. Pt mention she gave a release form to send document.

## 2021-06-07 ENCOUNTER — Other Ambulatory Visit: Payer: Self-pay | Admitting: Vascular Surgery

## 2021-07-17 ENCOUNTER — Other Ambulatory Visit: Payer: Self-pay | Admitting: Oncology

## 2021-07-17 DIAGNOSIS — M810 Age-related osteoporosis without current pathological fracture: Secondary | ICD-10-CM

## 2021-07-17 DIAGNOSIS — C50812 Malignant neoplasm of overlapping sites of left female breast: Secondary | ICD-10-CM

## 2021-07-23 ENCOUNTER — Other Ambulatory Visit: Payer: Self-pay

## 2021-10-03 ENCOUNTER — Ambulatory Visit: Payer: Managed Care, Other (non HMO) | Admitting: Oncology

## 2022-07-20 ENCOUNTER — Telehealth: Payer: Self-pay

## 2022-07-20 NOTE — Telephone Encounter (Signed)
Pt called asking if she should remain on ASA 81 mg or not.  Reviewed pt's chart, returned pt's call, two identifiers used. Informed pt that taking ASA 81 mg is beneficial, as long as she can tolerate it. Her risk of stroke was low and she will discuss with PCP to make a decision. Confirmed understanding.

## 2022-07-26 ENCOUNTER — Other Ambulatory Visit: Payer: Self-pay | Admitting: Hematology and Oncology

## 2022-07-26 DIAGNOSIS — M810 Age-related osteoporosis without current pathological fracture: Secondary | ICD-10-CM

## 2023-10-08 ENCOUNTER — Other Ambulatory Visit: Payer: Self-pay
